# Patient Record
Sex: Male | Born: 1980
Health system: Southern US, Community
[De-identification: ages and names within clinical notes are randomized; demographics above are authoritative.]

## PROBLEM LIST (undated history)

## (undated) DIAGNOSIS — M545 Low back pain, unspecified: Secondary | ICD-10-CM

## (undated) DIAGNOSIS — M75100 Unspecified rotator cuff tear or rupture of unspecified shoulder, not specified as traumatic: Secondary | ICD-10-CM

## (undated) DIAGNOSIS — I10 Essential (primary) hypertension: Secondary | ICD-10-CM

## (undated) DIAGNOSIS — E119 Type 2 diabetes mellitus without complications: Secondary | ICD-10-CM

## (undated) HISTORY — DX: Essential (primary) hypertension: I10

## (undated) HISTORY — DX: Unspecified rotator cuff tear or rupture of unspecified shoulder, not specified as traumatic: M75.100

## (undated) HISTORY — DX: Low back pain, unspecified: M54.50

## (undated) HISTORY — DX: Low back pain: M54.5

## (undated) HISTORY — DX: Type 2 diabetes mellitus without complications: E11.9

---

## 2008-08-31 ENCOUNTER — Emergency Department (HOSPITAL_BASED_OUTPATIENT_CLINIC_OR_DEPARTMENT_OTHER): Admission: EM | Admit: 2008-08-31 | Discharge: 2008-09-01 | Payer: Self-pay | Admitting: Emergency Medicine

## 2008-09-10 ENCOUNTER — Ambulatory Visit: Payer: Self-pay | Admitting: Physical Medicine & Rehabilitation

## 2008-09-20 ENCOUNTER — Encounter
Admission: RE | Admit: 2008-09-20 | Discharge: 2008-10-21 | Payer: Self-pay | Admitting: Physical Medicine & Rehabilitation

## 2009-02-22 ENCOUNTER — Emergency Department (HOSPITAL_BASED_OUTPATIENT_CLINIC_OR_DEPARTMENT_OTHER): Admission: EM | Admit: 2009-02-22 | Discharge: 2009-02-22 | Payer: Self-pay | Admitting: Emergency Medicine

## 2009-02-26 ENCOUNTER — Emergency Department (HOSPITAL_BASED_OUTPATIENT_CLINIC_OR_DEPARTMENT_OTHER): Admission: EM | Admit: 2009-02-26 | Discharge: 2009-02-26 | Payer: Self-pay | Admitting: Emergency Medicine

## 2009-02-26 ENCOUNTER — Ambulatory Visit: Payer: Self-pay | Admitting: Diagnostic Radiology

## 2010-10-21 LAB — DIFFERENTIAL
Basophils Relative: 1 % (ref 0–1)
Eosinophils Absolute: 0.2 10*3/uL (ref 0.0–0.7)
Neutrophils Relative %: 83 % — ABNORMAL HIGH (ref 43–77)

## 2010-10-21 LAB — COMPREHENSIVE METABOLIC PANEL
ALT: 51 U/L (ref 0–53)
Alkaline Phosphatase: 77 U/L (ref 39–117)
CO2: 29 mEq/L (ref 19–32)
GFR calc non Af Amer: >60 mL/min (ref >60)
Glucose, Bld: 125 mg/dL — ABNORMAL HIGH (ref 70–99)
Potassium: 4.1 mEq/L (ref 3.5–5.1)
Sodium: 147 mEq/L — ABNORMAL HIGH (ref 135–145)
Total Bilirubin: 1.3 mg/dL — ABNORMAL HIGH (ref 0.3–1.2)

## 2010-10-21 LAB — URINALYSIS, ROUTINE W REFLEX MICROSCOPIC
Ketones, ur: NEGATIVE mg/dL
Nitrite: NEGATIVE
Protein, ur: NEGATIVE mg/dL

## 2010-10-21 LAB — CBC
Hemoglobin: 17.1 g/dL — ABNORMAL HIGH (ref 13.0–17.0)
MCHC: 33.8 g/dL (ref 30.0–36.0)
RBC: 5.86 MIL/uL — ABNORMAL HIGH (ref 4.22–5.81)

## 2010-10-21 LAB — LIPASE, BLOOD: Lipase: 18 U/L — ABNORMAL LOW (ref 23–300)

## 2010-11-28 NOTE — Consult Note (Signed)
CONSULT REQUESTED BY:  This is a referral through the ED, Shelda Jakes, MD   CHIEF COMPLAINT:  Low back pain.   HISTORY:  A 30 year old male who states that he was stretching backwards  when he had onset of sharp, severe low back pain approximately 10 days  ago.  He went to the [facility] ED, where he was evaluated by  ENT physician, was given a prescription for cyclobenzaprine 10 mg t.i.d.  as well as ibuprofen 800 t.i.d. as well as hydrocodone 5/500 #20  tablets.  He has been taking all of these sparingly in fact he has only  taken 8 hydrocodones in the last 10 days.   He has had no lower extremity symptoms.  He has had no bowel or bladder  dysfunction.  His average pain has reduced over time as down to 3.   His pain is worse with bending, standing, improves with rest, and heat,  as well as medication.   Walking tolerance 90 minutes.  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  He states that this was not a  work-related injury.   His review of systems is positive for numbness and tingling, which is in  his hands mainly with coughing or moving, keeping his hands behind his  back.  He also has back spasms centered intermittent in nature.   Oswestry disability index score 12% which is in a mild range.   His past medical history is rather extensive in regards to his low back  and neck pain.  He had onset of pain in the upper back in 2005.  He was  in the [employer].  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  He was evaluated not only by his  primary care physician, but also Neurosurgery and Orthopedic Surgery.  They did an MRI of his cervical spine, which I did review the report  indicated some multilevel spondylosis, but this really looks mild except  at C7-T1, where he had some right-sided disk osteophyte  complex.   I do not see any imaging studies of his lumbar spine.  He denies any  history of sciatica in his lumbar spine.  He went through some physical  therapy for his upper back and this was helpful.  He also had an EMG and  CV in 2008 to evaluate his upper extremity numbness.  He states that he  did not tolerate it very well.  He was able to get the nerve conduction  study, but not the needle study.   Current medications are listed above.  His other past medical history is  otherwise negative.  I also reviewed some records from [place] where  he used to live up until about a year ago.  I also reviewed orthopedic  surgeon note, which referred to a CT lumbar spine showing central canal  stenosis, lateral recess stenosis L3-L4, retrolisthesis L3 on L4.  I do  not have the actual report, however.   PHYSICAL EXAMINATION:  GENERAL:  No acute distress.  Mood and affect  appropriate.  NECK:  Full range of motion.  His Spurling maneuver is negative.  Neck  has no evidence of scoliosis on inspection.  MUSCULOSKELETAL:  His impingement testing is negative for shoulders.  He  has 5/5  strength bilateral deltoid, biceps, triceps, grip as well as hip  flexion, extension, ankle dorsiflexion.  Sensation in the upper and  lower extremities to pinprick and light touch is normal.  He has full  range of motion upper and lower extremities.  NEUROLOGIC:  Gait is normal.  He is able toe walk, heel walk.  His back  has some tenderness midline around L4-L5, but this is not severe.  His  pain with extension is greater than flexion, but has full range.   IMPRESSION:  1. Lumbar pain may be lumbar strain versus an aggravation of      preexisting lumbar facet syndrome.  He has no signs of sciatica and      is gradually improving with time rather than worsening, therefore I      do not think any additional imaging studies are needed at this      time.  He has been using his medications sparingly and still  has      plenty left.  2. Cervical and thoracic pain.  I believe he may have a thoracic      outlet syndrome to account for his upper extremity symptoms given      that he has pain mainly with putting his hand behind his back as      well as coughing rather than with neck range of motion.Marland Kitchen   RECOMMENDATIONS:  We would have him go through some physical therapy,  fitness focus program looking at the cervical and thoracic stabilization  as well as lumbar flexion exercises.  Failing this may consider some  diagnostic injections, although at this point, his pain is relatively  mild and really do not expect have to go down the road.  This has been  discussed with the patient in detail.  I also explained using a spine  model.  He is agreement with the plan.  I will see him back in 2 weeks  after he goes through some physical therapy.      Erick Colace, M.D.  Electronically Signed     AEK/MedQ  D:09/10/2008 10:24:10  T:09/10/2008 21:50:42  Job #:  [id number]

## 2014-09-06 ENCOUNTER — Encounter (HOSPITAL_BASED_OUTPATIENT_CLINIC_OR_DEPARTMENT_OTHER): Payer: Self-pay | Admitting: *Deleted

## 2014-09-06 ENCOUNTER — Emergency Department (HOSPITAL_BASED_OUTPATIENT_CLINIC_OR_DEPARTMENT_OTHER)
Admission: EM | Admit: 2014-09-06 | Discharge: 2014-09-07 | Disposition: A | Discharge status: Home / Self Care | Payer: Non-veteran care | Attending: Emergency Medicine | Admitting: Emergency Medicine

## 2014-09-06 DIAGNOSIS — M791 Myalgia: Secondary | ICD-10-CM | POA: Insufficient documentation

## 2014-09-06 DIAGNOSIS — M5417 Radiculopathy, lumbosacral region: Secondary | ICD-10-CM | POA: Insufficient documentation

## 2014-09-06 DIAGNOSIS — Z791 Long term (current) use of non-steroidal anti-inflammatories (NSAID): Secondary | ICD-10-CM | POA: Diagnosis not present

## 2014-09-06 DIAGNOSIS — M545 Low back pain: Secondary | ICD-10-CM | POA: Diagnosis present

## 2014-09-06 LAB — URINALYSIS, ROUTINE W REFLEX MICROSCOPIC
BILIRUBIN URINE: NEGATIVE
GLUCOSE, UA: NEGATIVE mg/dL
Ketones, ur: NEGATIVE mg/dL
Leukocytes, UA: NEGATIVE
Nitrite: NEGATIVE
PH: 6 (ref 5.0–8.0)
Protein, ur: NEGATIVE mg/dL
SPECIFIC GRAVITY, URINE: 1.01 (ref 1.005–1.030)
UROBILINOGEN UA: 0.2 mg/dL (ref 0.0–1.0)

## 2014-09-06 LAB — URINE MICROSCOPIC-ADD ON

## 2014-09-06 MED ORDER — PREDNISONE 50 MG PO TABS: 50.0000 mg | ORAL_TABLET | Freq: Every day | ORAL | Status: DC

## 2014-09-06 MED ORDER — IBUPROFEN 400 MG PO TABS
600.0000 mg | ORAL_TABLET | Freq: Once | ORAL | Status: AC
  Administered 2014-09-06: 600 mg via ORAL
  Filled 2014-09-06 (×2): qty 1

## 2014-09-06 MED ORDER — HYDROCODONE-ACETAMINOPHEN 5-325 MG PO TABS
2.0000 | ORAL_TABLET | Freq: Once | ORAL | Status: AC
  Administered 2014-09-06: 2 via ORAL
  Filled 2014-09-06: qty 2

## 2014-09-06 MED ORDER — PREDNISONE 50 MG PO TABS
60.0000 mg | ORAL_TABLET | Freq: Once | ORAL | Status: AC
  Administered 2014-09-06: 60 mg via ORAL
  Filled 2014-09-06 (×2): qty 1

## 2014-09-06 MED ORDER — HYDROCODONE-ACETAMINOPHEN 5-325 MG PO TABS: 1.0000 | ORAL_TABLET | Freq: Four times a day (QID) | ORAL | Status: DC | PRN

## 2014-09-06 MED ORDER — DIAZEPAM 5 MG PO TABS
5.0000 mg | ORAL_TABLET | Freq: Once | ORAL | Status: AC
  Administered 2014-09-06: 5 mg via ORAL
  Filled 2014-09-06: qty 1

## 2014-09-06 NOTE — ED Provider Notes (Signed)
CSN: [id number]     Arrival date & time 09/06/14  2205 History  This chart was scribed for No att. providers found by [facility], ED Scribe. The patient was seen in MH06/MH06 and the patient's care was started at 11:17 PM.  Chief Complaint  Patient presents with  . Back Pain   Patient is a 34 y.o. male presenting with back pain. The history is provided by the patient. No language interpreter was used.  Back Pain Associated symptoms: no abdominal pain and no numbness     HPI Comments: James Mayer is a 34 y.o. male who presents to the Emergency Department complaining of constant sharp back pain ongoing for 10 years but this incident worsened 3 1/2 hours ago. Pt states he was uncomfortable while sitting at work, and did move a piece of furniture which was about 270 pounds with the help of two others. He was not lifting because of his back. Pain started a couple hours helping move. He also has pain in his left hip and radiating down his leg to his knee. Walking up stairs worsens the pain. He takes meloxicam, baclofen and tramadol for relief. No new numbness or tingling, no urine or bowel incontinence.   History reviewed. No pertinent past medical history. History reviewed. No pertinent past surgical history. History reviewed. No pertinent family history. History  Substance Use Topics  . Smoking status: Never Smoker   . Smokeless tobacco: Not on file  . Alcohol Use: No    Review of Systems  Gastrointestinal: Negative for abdominal pain.  Musculoskeletal: Positive for myalgias and back pain.  Neurological: Negative for numbness.   Allergies  Review of patient's allergies indicates no known allergies.  Home Medications   Prior to Admission medications   Medication Sig Start Date End Date Taking? Authorizing Provider  meloxicam (MOBIC) 7.5 MG tablet Take 7.5 mg by mouth daily.   Yes Historical Provider, MD  HYDROcodone-acetaminophen (NORCO/VICODIN) 5-325 MG per tablet Take 1  tablet by mouth every 6 (six) hours as needed. 09/06/14   Derwood Kaplan, MD  predniSONE (DELTASONE) 50 MG tablet Take 1 tablet (50 mg total) by mouth daily. 09/06/14   Donis Kotowski Rhunette Croft, MD   BP 157/93 mmHg  Pulse 97  Temp(Src) 98.2 F (36.8 C)  Resp 16  Ht  (1.803 m)  Wt 246 lb (111.585 kg)  BMI 34.33 kg/m2  SpO2 99% Physical Exam  Constitutional: He is oriented to person, place, and time. He appears well-developed and well-nourished. No distress.  HENT:  Head: Normocephalic and atraumatic.  Eyes: Pupils are equal, round, and reactive to light.  Neck: Normal range of motion.  Cardiovascular: Normal rate, regular rhythm and normal heart sounds.   Pulmonary/Chest: Effort normal and breath sounds normal. No respiratory distress.  Abdominal: Soft.  Musculoskeletal: Normal range of motion.  Lower lumbar and sacral area are TTP. TTP perispinal region as well. Negative passive straight leg raise bilaterally.  Neurological: He is alert and oriented to person, place, and time.  Reflex Scores:      Patellar reflexes are 2+ on the right side and 2+ on the left side. Skin: Skin is warm and dry.  Psychiatric: He has a normal mood and affect. His behavior is normal.  Nursing note and vitals reviewed.   ED Course  Procedures  DIAGNOSTIC STUDIES: Oxygen Saturation is 99% on room air, normal by my interpretation.    COORDINATION OF CARE: 7:39 AM Discussed treatment plan with pt at bedside  and pt agreed to plan.  Labs Review Labs Reviewed  URINALYSIS, ROUTINE W REFLEX MICROSCOPIC - Abnormal; Notable for the following:    Hgb urine dipstick TRACE (*)    All other components within normal limits  URINE MICROSCOPIC-ADD ON    Imaging Review No results found.   EKG Interpretation None      MDM   Final diagnoses:  Lumbosacral radiculopathy   DDx includes: - DJD of the back - Spondylitises/ spondylosis - Sciatica - Spinal cord compression - Conus medullaris - Epidural  hematoma - Epidural abscess - Lytic/pathologic fracture - Myelitis - Musculoskeletal pain   Pt comes in with cc of back pain. Has NO NEW associated numbness, weakness, urinary incontinence, urinary retention, bowel incontinence, saddle anesthesia and the neuro exam is reassuring. Suspect some radicular pain - and will give him appropriate pain meds. Advised pcp f/u if not improving.    Derwood KaplanAnkit Evin Chirco, MD 09/07/14 [phone]

## 2014-09-06 NOTE — ED Notes (Signed)
Pt c/o bil lower back pain x 1 day

## 2014-09-06 NOTE — Discharge Instructions (Signed)
We saw you in the ER for back pain. Our exam doesn't indicate any spinal cord involvement - and thus we feel comfortable sending you home. Please take the meloxicam as prescribed and the muscle relaxant as well. Take the stronger pain meds as needed, and see your primary care doctor for further pain control.  Please use the back exercises to strengthen the back muscles.   Back Exercises Back exercises help treat and prevent back injuries. The goal of back exercises is to increase the strength of your abdominal and back muscles and the flexibility of your back. These exercises should be started when you no longer have back pain. Back exercises include:  Pelvic Tilt. Lie on your back with your knees bent. Tilt your pelvis until the lower part of your back is against the floor. Hold this position 5 to 10 sec and repeat 5 to 10 times.  Knee to Chest. Pull first 1 knee up against your chest and hold for 20 to 30 seconds, repeat this with the other knee, and then both knees. This may be done with the other leg straight or bent, whichever feels better.  Sit-Ups or Curl-Ups. Bend your knees 90 degrees. Start with tilting your pelvis, and do a partial, slow sit-up, lifting your trunk only 30 to 45 degrees off the floor. Take at least 2 to 3 seconds for each sit-up. Do not do sit-ups with your knees out straight. If partial sit-ups are difficult, simply do the above but with only tightening your abdominal muscles and holding it as directed.  Hip-Lift. Lie on your back with your knees flexed 90 degrees. Push down with your feet and shoulders as you raise your hips a couple inches off the floor; hold for 10 seconds, repeat 5 to 10 times.  Back arches. Lie on your stomach, propping yourself up on bent elbows. Slowly press on your hands, causing an arch in your low back. Repeat 3 to 5 times. Any initial stiffness and discomfort should lessen with repetition over time.  Shoulder-Lifts. Lie face down with arms  beside your body. Keep hips and torso pressed to floor as you slowly lift your head and shoulders off the floor. Do not overdo your exercises, especially in the beginning. Exercises may cause you some mild back discomfort which lasts for a few minutes; however, if the pain is more severe, or lasts for more than 15 minutes, do not continue exercises until you see your caregiver. Improvement with exercise therapy for back problems is slow.  See your caregivers for assistance with developing a proper back exercise program. Document Released: 08/09/2004 Document Revised: 09/24/2011 Document Reviewed: 05/03/2011 [facility] Patient Information 2015 [employer]. This information is not intended to replace advice given to you by your health care provider. Make sure you discuss any questions you have with your health care provider.  Lumbosacral Radiculopathy Lumbosacral radiculopathy is a pinched nerve or nerves in the low back (lumbosacral area). When this happens you may have weakness in your legs and may not be able to stand on your toes. You may have pain going down into your legs. There may be difficulties with walking normally. There are many causes of this problem. Sometimes this may happen from an injury, or simply from arthritis or boney problems. It may also be caused by other illnesses such as diabetes. If there is no improvement after treatment, further studies may be done to find the exact cause. DIAGNOSIS  X-rays may be needed if the problems become  long standing. Electromyograms may be done. This study is one in which the working of nerves and muscles is studied. HOME CARE INSTRUCTIONS   Applications of ice packs may be helpful. Ice can be used in a plastic bag with a towel around it to prevent frostbite to skin. This may be used every 2 hours for 20 to 30 minutes, or as needed, while awake, or as directed by your caregiver.  Only take over-the-counter or prescription medicines for pain,  discomfort, or fever as directed by your caregiver.  If physical therapy was prescribed, follow your caregiver's directions. SEEK IMMEDIATE MEDICAL CARE IF:   You have pain not controlled with medications.  You seem to be getting worse rather than better.  You develop increasing weakness in your legs.  You develop loss of bowel or bladder control.  You have difficulty with walking or balance, or develop clumsiness in the use of your legs.  You have a fever. MAKE SURE YOU:   Understand these instructions.  Will watch your condition.  Will get help right away if you are not doing well or get worse. Document Released: 07/02/2005 Document Revised: 09/24/2011 Document Reviewed: 02/20/2008 [facility] Patient Information 2015 [place], [place]. This information is not intended to replace advice given to you by your health care provider. Make sure you discuss any questions you have with your health care provider. RICE: Routine Care for Injuries The routine care of many injuries includes Rest, Ice, Compression, and Elevation (RICE). HOME CARE INSTRUCTIONS  Rest is needed to allow your body to heal. Routine activities can usually be resumed when comfortable. Injured tendons and bones can take up to 6 weeks to heal. Tendons are the cord-like structures that attach muscle to bone.  Ice following an injury helps keep the swelling down and reduces pain.  Put ice in a plastic bag.  Place a towel between your skin and the bag.  Leave the ice on for 15-20 minutes, 3-4 times a day, or as directed by your health care provider. Do this while awake, for the first 24 to 48 hours. After that, continue as directed by your caregiver.  Compression helps keep swelling down. It also gives support and helps with discomfort. If an elastic bandage has been applied, it should be removed and reapplied every 3 to 4 hours. It should not be applied tightly, but firmly enough to keep swelling down. Watch fingers or  toes for swelling, bluish discoloration, coldness, numbness, or excessive pain. If any of these problems occur, remove the bandage and reapply loosely. Contact your caregiver if these problems continue.  Elevation helps reduce swelling and decreases pain. With extremities, such as the arms, hands, legs, and feet, the injured area should be placed near or above the level of the heart, if possible. SEEK IMMEDIATE MEDICAL CARE IF:  You have persistent pain and swelling.  You develop redness, numbness, or unexpected weakness.  Your symptoms are getting worse rather than improving after several days. These symptoms may indicate that further evaluation or further X-rays are needed. Sometimes, X-rays may not show a small broken bone (fracture) until 1 week or 10 days later. Make a follow-up appointment with your caregiver. Ask when your X-ray results will be ready. Make sure you get your X-ray results. Document Released: 10/14/2000 Document Revised: 07/07/2013 Document Reviewed: 12/01/2010 [facility] Patient Information 2015 [place], [place]. This information is not intended to replace advice given to you by your health care provider. Make sure you discuss any questions you have  with your health care provider.

## 2017-11-14 LAB — CBC AND DIFFERENTIAL
HEMATOCRIT: 44 (ref 41–53)
Hemoglobin: 15.4 (ref 13.5–17.5)
Neutrophils Absolute: 6
Platelets: 245 (ref 150–399)
WBC: 9.2

## 2017-11-14 LAB — HEMOGLOBIN A1C: HEMOGLOBIN A1C: 5.7

## 2017-11-14 LAB — HEPATIC FUNCTION PANEL
ALT: 67 — AB (ref 10–40)
AST: 26 (ref 14–40)
Alkaline Phosphatase: 60 (ref 25–125)
BILIRUBIN DIRECT: 0.2 (ref 0.01–0.4)
BILIRUBIN, TOTAL: 1.4

## 2017-11-14 LAB — LIPID PANEL
Cholesterol: 276 — AB (ref 0–200)
HDL: 36 (ref 35–70)
LDL Cholesterol: 173
Triglycerides: 334 — AB (ref 40–160)

## 2017-11-14 LAB — BASIC METABOLIC PANEL
BUN: 10 (ref 4–21)
CREATININE: 1.1 (ref 0.6–1.3)
GLUCOSE: 98
POTASSIUM: 3.3 — AB (ref 3.4–5.3)
SODIUM: 140 (ref 137–147)

## 2017-11-14 LAB — HM HEPATITIS C SCREENING LAB: HM Hepatitis Screen: NEGATIVE

## 2018-08-19 ENCOUNTER — Ambulatory Visit: Payer: Non-veteran care | Admitting: Family Medicine

## 2018-08-26 ENCOUNTER — Ambulatory Visit (INDEPENDENT_AMBULATORY_CARE_PROVIDER_SITE_OTHER): Payer: BLUE CROSS/BLUE SHIELD | Admitting: Family Medicine

## 2018-08-26 ENCOUNTER — Encounter: Payer: Self-pay | Admitting: Family Medicine

## 2018-08-26 VITALS — BP 162/96 | HR 80 | Temp 98.0°F | Ht 71.0 in | Wt 246.0 lb

## 2018-08-26 DIAGNOSIS — G8929 Other chronic pain: Secondary | ICD-10-CM | POA: Diagnosis not present

## 2018-08-26 DIAGNOSIS — I10 Essential (primary) hypertension: Secondary | ICD-10-CM | POA: Diagnosis not present

## 2018-08-26 DIAGNOSIS — M545 Low back pain: Secondary | ICD-10-CM | POA: Diagnosis not present

## 2018-08-26 MED ORDER — TRAMADOL HCL 50 MG PO TABS: 50.0000 mg | ORAL_TABLET | Freq: Three times a day (TID) | ORAL | 0 refills | Status: DC | PRN

## 2018-08-26 NOTE — Patient Instructions (Signed)
-  Great to meet you today! -We'll be in touch once we get records in from the [place].

## 2018-08-26 NOTE — Progress Notes (Signed)
James BleakJason D Oborn - 38 y.o. male MRN [id number]  Date of birth: 1981-03-12  Subjective Chief Complaint  Patient presents with  . Back Pain    has been chronic in nature-[facility] ([facility] pain is lower back    HPI James Mayer is a 38 y.o. male with history of HTN and chronic low back pain here today for initial visit.  [facility] [place].  He admits frustration with his ongoing back pain and feels like [facility]. He reports having imaging completed, last time was about 5 years ago and was told that he had pars defect.  Pain is located along bilateral lower back.  He denies radiation of pain, numbness or tingling.  He has seen a chiropractor and this was helpful He has been treated with meloxicam and tramadol for several years.  Tramadol is helpful but he doesn't like taking too often as it makes him feel a little "loopy".  Meloxicam does provide some relief.  He has tried muscle relaxer in the past but this made him too drowsy.  He is not sure what labs he has had checked in the past.    BP noted to be elevated today.  [facility]  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  He has considered counseling.  He denies SI.     No Known Allergies  Past Medical History:  Diagnosis Date  . Hypertension   . Low back pain     History reviewed. No pertinent surgical history.  Social History   Socioeconomic History  . Marital status: Married    Spouse name: Not on file  . Number of children: Not on file  . Years of education: Not on file  . Highest education level: Not on file  Occupational History  . Not on file  Social Needs  . Financial resource strain: Not on file  . Food insecurity:    Worry: Not on file    Inability: Not on file  . Transportation needs:    Medical: Not on file     Non-medical: Not on file  Tobacco Use  . Smoking status: Never Smoker  . Smokeless tobacco: Never Used  Substance and Sexual Activity  . Alcohol use: Yes  . Drug use: No  . Sexual activity: Yes    Birth control/protection: None  Lifestyle  . Physical activity:    Days per week: Not on file    Minutes per session: Not on file  . Stress: Not on file  Relationships  . Social connections:    Talks on phone: Not on file    Gets together: Not on file    Attends religious service: Not on file    Active member of club or organization: Not on file    Attends meetings of clubs or organizations: Not on file    Relationship status: Not on file  Other Topics Concern  . Not on file  Social History Narrative  . Not on file    Family History  Problem Relation Age of Onset  . Hypertension Father   . Hyperlipidemia Father   . Diabetes Sister     Health Maintenance  Topic Date Due  . HIV Screening  05/03/1998  . TETANUS/TDAP  05/03/2002  . INFLUENZA  VACCINE  10/14/2018 (Originally 02/13/2018)    ----------------------------------------------------------------------------------------------------------------------------------------------------------------------------------------------------------------- Physical Exam BP (!) 162/96   Pulse 80   Temp 98 F (36.7 C) (Oral)   Ht 5\' 11"  (1.803 m)   Wt 246 lb (111.6 kg)   SpO2 98%   BMI 34.31 kg/m   Physical Exam Constitutional:      Appearance: Normal appearance.  HENT:     Head: Normocephalic and atraumatic.     Mouth/Throat:     Mouth: Mucous membranes are moist.  Neck:     Musculoskeletal: Neck supple.  Cardiovascular:     Rate and Rhythm: Normal rate.     Pulses: Normal pulses.     Heart sounds: Normal heart sounds.  Pulmonary:     Effort: Pulmonary effort is normal.     Breath sounds: Normal breath sounds.  Musculoskeletal:     Comments: ROM of L spine is normal but has some pain with flexion and full extension.   No stepoff palpated.  No SI joint tenderness.  Negative SLR.  Normal strength.   Skin:    General: Skin is warm.  Neurological:     General: No focal deficit present.     Mental Status: He is alert.  Psychiatric:        Mood and Affect: Mood normal.        Behavior: Behavior normal.     ------------------------------------------------------------------------------------------------------------------------------------------------------------------------------------------------------------------- Assessment and Plan  Chronic low back pain -Chronic low back pain, will request records [facility] [place] [facility] to see what work up and imaging he has had so far.   -PDMP reviewed, last tramadol refill 11/2017.  Will refill #30 until [facility].  He will continue meloxicam as well.    Essential hypertension -BP elevated, likely several factors contributing to his baseline HTN including anxiety and pain.   -Discussed stress management -Follow low salt diet.

## 2018-08-26 NOTE — Assessment & Plan Note (Signed)
-  BP elevated, likely several factors contributing to his baseline HTN including anxiety and pain.   -Discussed stress management -Follow low salt diet.

## 2018-08-26 NOTE — Assessment & Plan Note (Signed)
-  Chronic low back pain, [facility] what [facility] up and imaging he has had so far.   -PDMP reviewed, last tramadol refill 11/2017.  Will refill #30 until [facility].  He will continue meloxicam as well.

## 2018-08-29 ENCOUNTER — Encounter: Payer: Self-pay | Admitting: Family Medicine

## 2018-11-10 ENCOUNTER — Telehealth: Payer: Self-pay

## 2018-11-10 NOTE — Telephone Encounter (Signed)
Copied from CRM [id number]. Topic: Appointment Scheduling - Scheduling Inquiry for Clinic >> Nov 10, 2018  1:33 PM Maia Petties wrote: Reason for CRM: Received msg from pts wife requesting to contact pt for an appt. He is having terrible back pain and would like a virtual visit with Dr. Ashley Royalty.

## 2018-11-11 ENCOUNTER — Ambulatory Visit (INDEPENDENT_AMBULATORY_CARE_PROVIDER_SITE_OTHER): Payer: BLUE CROSS/BLUE SHIELD | Admitting: Family Medicine

## 2018-11-11 ENCOUNTER — Encounter: Payer: Self-pay | Admitting: Family Medicine

## 2018-11-11 DIAGNOSIS — M545 Low back pain: Secondary | ICD-10-CM

## 2018-11-11 DIAGNOSIS — G8929 Other chronic pain: Secondary | ICD-10-CM

## 2018-11-11 MED ORDER — TIZANIDINE HCL 4 MG PO TABS: 4.0000 mg | ORAL_TABLET | Freq: Four times a day (QID) | ORAL | 0 refills | Status: DC | PRN

## 2018-11-11 NOTE — Progress Notes (Signed)
James Mayer - 38 y.o. male MRN [id number]  Date of birth: 12/14/1980   This visit type was conducted due to national recommendations for restrictions regarding the COVID-19 Pandemic (e.g. social distancing).  This format is felt to be most appropriate for this patient at this time.  All issues noted in this document were discussed and addressed.  No physical exam was performed (except for noted visual exam findings with Video Visits).  I discussed the limitations of evaluation and management by telemedicine and the availability of in person appointments. The patient expressed understanding and agreed to proceed.  I connected with@ on 11/11/18 at  9:45 AM EDT by a video enabled telemedicine application and verified that I am speaking with the correct person using two identifiers.   Patient Location: Home [address] [place] [address]   Provider location:   Home office  Chief Complaint  Patient presents with  . Back Pain    Onset: 5 days, Hx flared up, episodic, pain level 2-9,L-spin,RADS laterally to LL    HPI  James Mayer is a 38 y.o. male who presents via audio/video conferencing for a telehealth visit today.  He has complaint of low back pain.  This is an acute on chronic issue for him.  He initially started having problem with his back in the [employer] and it has flared up from time to time.  Has tramadol for flare ups but hasn't been much help recently.  He also takes meloxicam daily.  He did have radiation of pain initially but this has improved and his pain overall has improved some today.  He denies weakness, numbness or tingling.    ROS:  A comprehensive ROS was completed and negative except as noted per HPI  Past Medical History:  Diagnosis Date  . Hypertension   . Low back pain     No past surgical history on file.  Family History  Problem Relation Age of Onset  . Hypertension Father   . Hyperlipidemia Father   . Diabetes Sister     Social  History   Socioeconomic History  . Marital status: Married    Spouse name: Not on file  . Number of children: Not on file  . Years of education: Not on file  . Highest education level: Not on file  Occupational History  . Not on file  Social Needs  . Financial resource strain: Not on file  . Food insecurity:    Worry: Not on file    Inability: Not on file  . Transportation needs:    Medical: Not on file    Non-medical: Not on file  Tobacco Use  . Smoking status: Never Smoker  . Smokeless tobacco: Never Used  Substance and Sexual Activity  . Alcohol use: Yes  . Drug use: No  . Sexual activity: Yes    Birth control/protection: None  Lifestyle  . Physical activity:    Days per week: Not on file    Minutes per session: Not on file  . Stress: Not on file  Relationships  . Social connections:    Talks on phone: Not on file    Gets together: Not on file    Attends religious service: Not on file    Active member of club or organization: Not on file    Attends meetings of clubs or organizations: Not on file    Relationship status: Not on file  . Intimate partner violence:    Fear of current  or ex partner: Not on file    Emotionally abused: Not on file    Physically abused: Not on file    Forced sexual activity: Not on file  Other Topics Concern  . Not on file  Social History Narrative  . Not on file     Current Outpatient Medications:  .  hydrochlorothiazide (HYDRODIURIL) 25 MG tablet, Take 25 mg by mouth daily., Disp: , Rfl:  .  meloxicam (MOBIC) 7.5 MG tablet, Take 7.5 mg by mouth daily., Disp: , Rfl:  .  traMADol (ULTRAM) 50 MG tablet, Take 1 tablet (50 mg total) by mouth every 8 (eight) hours as needed for moderate pain., Disp: 30 tablet, Rfl: 0 .  tiZANidine (ZANAFLEX) 4 MG tablet, Take 1 tablet (4 mg total) by mouth every 6 (six) hours as needed for muscle spasms., Disp: 30 tablet, Rfl: 0  EXAM:  VITALS per patient if applicable: Temp 98.1 F (36.7 C) (Oral)  Comment: took @ [facility]  Ht 5' 11.5" (1.816 m)   Wt 247 lb (112 kg) Comment: Pt took at [employer].  BMI 33.97 kg/m   GENERAL: alert, oriented, appears well and in no acute distress  HEENT: atraumatic, conjunttiva clear, no obvious abnormalities on inspection of external nose and ears  NECK: normal movements of the head and neck  LUNGS: on inspection no signs of respiratory distress, breathing rate appears normal, no obvious gross SOB, gasping or wheezing  CV: no obvious cyanosis  MS: moves all visible extremities without noticeable abnormality  PSYCH/NEURO: pleasant and cooperative, no obvious depression or anxiety, speech and thought processing grossly intact  ASSESSMENT AND PLAN:  Discussed the following assessment and plan:  Chronic low back pain -Acute on chronic problem.  -Continue meloxicam with tramadol prn.  -Will add on tizanidine.  -Recommend that he try to keep moving and keep back stretched out.  -He will call back if not improving.        I discussed the assessment and treatment plan with the patient. The patient was provided an opportunity to ask questions and all were answered. The patient agreed with the plan and demonstrated an understanding of the instructions.   The patient was advised to call back or seek an in-person evaluation if the symptoms worsen or if the condition fails to improve as anticipated.     James Mayer James Pullman, DO

## 2018-11-11 NOTE — Assessment & Plan Note (Signed)
-  Acute on chronic problem.  -Continue meloxicam with tramadol prn.  -Will add on tizanidine.  -Recommend that he try to keep moving and keep back stretched out.  -He will call back if not improving.

## 2019-02-12 ENCOUNTER — Telehealth (INDEPENDENT_AMBULATORY_CARE_PROVIDER_SITE_OTHER): Payer: BC Managed Care – PPO | Admitting: Family Medicine

## 2019-02-12 ENCOUNTER — Telehealth: Payer: Self-pay

## 2019-02-12 ENCOUNTER — Encounter: Payer: Self-pay | Admitting: Family Medicine

## 2019-02-12 DIAGNOSIS — M545 Low back pain, unspecified: Secondary | ICD-10-CM

## 2019-02-12 DIAGNOSIS — G8929 Other chronic pain: Secondary | ICD-10-CM | POA: Diagnosis not present

## 2019-02-12 MED ORDER — TRAMADOL HCL 50 MG PO TABS: 50.0000 mg | ORAL_TABLET | Freq: Three times a day (TID) | ORAL | 0 refills | Status: DC | PRN

## 2019-02-12 MED ORDER — PREDNISONE 10 MG PO TABS: ORAL_TABLET | ORAL | 0 refills | Status: DC

## 2019-02-12 MED ORDER — TIZANIDINE HCL 4 MG PO TABS: 4.0000 mg | ORAL_TABLET | Freq: Four times a day (QID) | ORAL | 0 refills | Status: DC | PRN

## 2019-02-12 NOTE — Progress Notes (Signed)
James Mayer - 38 y.o. male MRN [id number]  Date of birth: Sep 12, 1980   This visit type was conducted due to national recommendations for restrictions regarding the COVID-19 Pandemic (e.g. social distancing).  This format is felt to be most appropriate for this patient at this time.  All issues noted in this document were discussed and addressed.  No physical exam was performed (except for noted visual exam findings with Video Visits).  I discussed the limitations of evaluation and management by telemedicine and the availability of in person appointments. The patient expressed understanding and agreed to proceed.  I connected with@ on 02/12/19 at  8:30 AM EDT by a video enabled telemedicine application and verified that I am speaking with the correct person using two identifiers.   Patient Location: Home  [address] [place] [address]   Provider location:   Home office  Chief Complaint  Patient presents with  . Back Pain    Pt agrees to virtual visit.  He is C/O chronic LBP. Current flare-up started 2.5 weeks-ago. Is reqesting Tramadol to help with his pain.  He states that he has 15 Tizanidine left. Does not have any vitals.    HPI  James Mayer is a 38 y.o. male who presents via audio/video conferencing for a telehealth visit today.  He has complaint of low back pain. Has history of chronic low back pain with severe flare up intermittently.  Reports that his flare ups often occur when he coughs of sneezes hard, which precipitated this event.  He is taking meloxicam 15mg  daily and will add on tizanidine and tramadol when he is having a flare.  He has tried PT in the past with variable improvement, however the effects have not been long lasting.  He did see a chiropractor for a visit which was helpful briefly but has not been back since.  He does have some mild numbness and also gets some occasional tingling in hands with sneezing.  He denies weakness.    ROS:  A  comprehensive ROS was completed and negative except as noted per HPI  Past Medical History:  Diagnosis Date  . Hypertension   . Low back pain     No past surgical history on file.  Family History  Problem Relation Age of Onset  . Hypertension Father   . Hyperlipidemia Father   . Diabetes Sister     Social History   Socioeconomic History  . Marital status: Married    Spouse name: Not on file  . Number of children: Not on file  . Years of education: Not on file  . Highest education level: Not on file  Occupational History  . Not on file  Social Needs  . Financial resource strain: Not on file  . Food insecurity    Worry: Not on file    Inability: Not on file  . Transportation needs    Medical: Not on file    Non-medical: Not on file  Tobacco Use  . Smoking status: Never Smoker  . Smokeless tobacco: Never Used  Substance and Sexual Activity  . Alcohol use: Yes  . Drug use: No  . Sexual activity: Yes    Birth control/protection: None  Lifestyle  . Physical activity    Days per week: Not on file    Minutes per session: Not on file  . Stress: Not on file  Relationships  . Social connections    Talks on phone: Not on file  Gets together: Not on file    Attends religious service: Not on file    Active member of club or organization: Not on file    Attends meetings of clubs or organizations: Not on file    Relationship status: Not on file  . Intimate partner violence    Fear of current or ex partner: Not on file    Emotionally abused: Not on file    Physically abused: Not on file    Forced sexual activity: Not on file  Other Topics Concern  . Not on file  Social History Narrative  . Not on file     Current Outpatient Medications:  .  hydrochlorothiazide (HYDRODIURIL) 25 MG tablet, Take 25 mg by mouth daily., Disp: , Rfl:  .  meloxicam (MOBIC) 7.5 MG tablet, Take 7.5 mg by mouth daily., Disp: , Rfl:  .  tiZANidine (ZANAFLEX) 4 MG tablet, Take 1 tablet (4  mg total) by mouth every 6 (six) hours as needed for muscle spasms., Disp: 30 tablet, Rfl: 0 .  traMADol (ULTRAM) 50 MG tablet, Take 1 tablet (50 mg total) by mouth every 8 (eight) hours as needed for moderate pain., Disp: 30 tablet, Rfl: 0  EXAM:  VITALS per patient if applicable: There were no vitals taken for this visit.  GENERAL: alert, oriented, appears well and in no acute distress  HEENT: atraumatic, conjunttiva clear, no obvious abnormalities on inspection of external nose and ears  NECK: normal movements of the head and neck  LUNGS: on inspection no signs of respiratory distress, breathing rate appears normal, no obvious gross SOB, gasping or wheezing  CV: no obvious cyanosis  MS: moves all visible extremities without noticeable abnormality  PSYCH/NEURO: pleasant and cooperative, no obvious depression or anxiety, speech and thought processing grossly intact  ASSESSMENT AND PLAN:  Discussed the following assessment and plan:  Chronic low back pain -Currently having acute flare of chronic pain.  -Will add on predisone and he will continue tizanidine and tramadol as needed.  -Has HEP that he is working on at home.  -Discussed referral back to PT or to Dr. Terrilee FilesZach Smith for OMT if not improving.        I discussed the assessment and treatment plan with the patient. The patient was provided an opportunity to ask questions and all were answered. The patient agreed with the plan and demonstrated an understanding of the instructions.   The patient was advised to call back or seek an in-person evaluation if the symptoms worsen or if the condition fails to improve as anticipated.    Everrett Coombeody Yameli Delamater, DO

## 2019-02-12 NOTE — Telephone Encounter (Signed)
I LM for pt.

## 2019-02-12 NOTE — Assessment & Plan Note (Signed)
-  Currently having acute flare of chronic pain.  -Will add on predisone and he will continue tizanidine and tramadol as needed.  -Has HEP that he is working on at home.  -Discussed referral back to PT or to Dr. Charlann Boxer for OMT if not improving.

## 2019-04-14 ENCOUNTER — Encounter: Payer: Self-pay | Admitting: Family Medicine

## 2019-04-14 ENCOUNTER — Ambulatory Visit (INDEPENDENT_AMBULATORY_CARE_PROVIDER_SITE_OTHER): Payer: BC Managed Care – PPO | Admitting: Family Medicine

## 2019-04-14 ENCOUNTER — Other Ambulatory Visit: Payer: Self-pay

## 2019-04-14 VITALS — BP 145/80 | HR 97 | Wt 248.8 lb

## 2019-04-14 DIAGNOSIS — R197 Diarrhea, unspecified: Secondary | ICD-10-CM | POA: Diagnosis not present

## 2019-04-14 DIAGNOSIS — M2550 Pain in unspecified joint: Secondary | ICD-10-CM

## 2019-04-14 LAB — SEDIMENTATION RATE: Sed Rate: 15 mm/hr (ref 0–15)

## 2019-04-14 LAB — C-REACTIVE PROTEIN: CRP: <1 mg/dL (ref 0.5–20.0)

## 2019-04-14 LAB — URIC ACID: Uric Acid, Serum: 7.9 mg/dL — ABNORMAL HIGH (ref 4.0–7.8)

## 2019-04-14 MED ORDER — TRAMADOL HCL 50 MG PO TABS: 50.0000 mg | ORAL_TABLET | Freq: Three times a day (TID) | ORAL | 1 refills | Status: DC | PRN

## 2019-04-14 MED ORDER — DIPHENOXYLATE-ATROPINE 2.5-0.025 MG PO TABS: 1.0000 | ORAL_TABLET | Freq: Four times a day (QID) | ORAL | 0 refills | Status: DC | PRN

## 2019-04-14 NOTE — Assessment & Plan Note (Signed)
  Lab Orders     Sedimentation rate     C-reactive protein     Uric acid Recommend OTC 1% diclofenac to area and icing.

## 2019-04-14 NOTE — Assessment & Plan Note (Signed)
-  Likely viral in nature.  No other symptoms suggestive of COVID at this time.  Will check inflammatory markers given associated joint pain as well.  Discussed further work up if not improving over the next week or so.

## 2019-04-14 NOTE — Patient Instructions (Signed)
Try voltaren gel and icing to thumb area.  We'll be in touch with lab results.

## 2019-04-14 NOTE — Progress Notes (Signed)
James Mayer - 38 y.o. male MRN [id number]  Date of birth: 1980-08-16  Subjective Chief Complaint  Patient presents with  . Hand Pain    Right hand    HPI James Mayer is a 38 y.o. male with history of chronic low back pain and HTN here today with complaint of R hand pain and diarrhea.  He reports that diarrhea started a day or two ago.  Stools are very loose.  He does have some abdominal pain and cramping and pain.  He denies blood in his stool or dark stools.  Pain is throughout the abdomen and is non-radiating and non-migratory.  He has no known family history of inflammatory bowel disease.    In regards to the hand/wrist pain.  He woke up this morning with R hand and wrist pain with swelling around the base of the R thumb.  He denies any known injury or overuse.  Denies pain in other joints other than his back which is chronic.    ROS:  A comprehensive ROS was completed and negative except as noted per HPI  No Known Allergies  Past Medical History:  Diagnosis Date  . Hypertension   . Low back pain     No past surgical history on file.  Social History   Socioeconomic History  . Marital status: Married    Spouse name: Not on file  . Number of children: Not on file  . Years of education: Not on file  . Highest education level: Not on file  Occupational History  . Not on file  Social Needs  . Financial resource strain: Not on file  . Food insecurity    Worry: Not on file    Inability: Not on file  . Transportation needs    Medical: Not on file    Non-medical: Not on file  Tobacco Use  . Smoking status: Never Smoker  . Smokeless tobacco: Never Used  Substance and Sexual Activity  . Alcohol use: Yes  . Drug use: No  . Sexual activity: Yes    Birth control/protection: None  Lifestyle  . Physical activity    Days per week: Not on file    Minutes per session: Not on file  . Stress: Not on file  Relationships  . Social Musician on phone: Not  on file    Gets together: Not on file    Attends religious service: Not on file    Active member of club or organization: Not on file    Attends meetings of clubs or organizations: Not on file    Relationship status: Not on file  Other Topics Concern  . Not on file  Social History Narrative  . Not on file    Family History  Problem Relation Age of Onset  . Hypertension Father   . Hyperlipidemia Father   . Diabetes Sister     Health Maintenance  Topic Date Due  . HIV Screening  05/03/1998  . TETANUS/TDAP  05/03/2002  . INFLUENZA VACCINE  02/14/2019    ----------------------------------------------------------------------------------------------------------------------------------------------------------------------------------------------------------------- Physical Exam BP (!) 145/80   Pulse 97   Wt 248 lb 12.8 oz (112.9 kg)   SpO2 97%   BMI 34.22 kg/m   Physical Exam Constitutional:      Appearance: Normal appearance.  HENT:     Head: Normocephalic and atraumatic.     Mouth/Throat:     Mouth: Mucous membranes are moist.  Cardiovascular:     Rate and  Rhythm: Normal rate and regular rhythm.  Pulmonary:     Effort: Pulmonary effort is normal.     Breath sounds: Normal breath sounds.  Abdominal:     General: Abdomen is flat. There is no distension.     Tenderness: There is no abdominal tenderness.  Musculoskeletal:     Comments: TTP along base of thumb at [facility] joint on R hand.   Skin:    General: Skin is warm and dry.  Neurological:     General: No focal deficit present.     Mental Status: He is alert.  Psychiatric:        Mood and Affect: Mood normal.        Behavior: Behavior normal.     ------------------------------------------------------------------------------------------------------------------------------------------------------------------------------------------------------------------- Assessment and Plan  Arthralgia  Lab Orders      Sedimentation rate     C-reactive protein     Uric acid Recommend OTC 1% diclofenac to area and icing.    Diarrhea -Likely viral in nature.  No other symptoms suggestive of COVID at this time.  Will check inflammatory markers given associated joint pain as well.  Discussed further work up if not improving over the next week or so.

## 2019-04-16 ENCOUNTER — Telehealth: Payer: Self-pay | Admitting: Family Medicine

## 2019-04-16 NOTE — Telephone Encounter (Signed)
Pt wife Abbi called for lab results. Cb# [phone] Copied from [place] [id number]. Topic: Quick Communication - Lab Results (Clinic Use ONLY) >> Apr 16, 2019  2:53 PM Noitamyae, Phetcharat, LPN wrote: Called patient to inform them of 04/16/2019 lab results. When patient returns call, triage nurse may disclose results.

## 2019-04-16 NOTE — Progress Notes (Signed)
Inflammatory markers are normal.  Uric acid levels are slightly elevated, which can cause gout.  Has hand/wrist pain improved?

## 2019-04-17 ENCOUNTER — Other Ambulatory Visit: Payer: Self-pay | Admitting: Family Medicine

## 2019-04-17 MED ORDER — METHYLPREDNISOLONE 4 MG PO TBPK: ORAL_TABLET | ORAL | 0 refills | Status: DC

## 2019-04-17 NOTE — Progress Notes (Signed)
Course of steroid sent in, if not improving with this let me know and I'll enter referral to sports medicine.

## 2019-07-31 ENCOUNTER — Other Ambulatory Visit: Payer: Self-pay

## 2019-07-31 ENCOUNTER — Encounter: Payer: Self-pay | Admitting: Family Medicine

## 2019-07-31 ENCOUNTER — Ambulatory Visit (INDEPENDENT_AMBULATORY_CARE_PROVIDER_SITE_OTHER): Payer: BC Managed Care – PPO

## 2019-07-31 ENCOUNTER — Ambulatory Visit (INDEPENDENT_AMBULATORY_CARE_PROVIDER_SITE_OTHER): Payer: BC Managed Care – PPO | Admitting: Family Medicine

## 2019-07-31 ENCOUNTER — Ambulatory Visit: Payer: BC Managed Care – PPO | Admitting: Family Medicine

## 2019-07-31 VITALS — BP 140/92 | HR 89 | Temp 96.3°F | Ht 71.5 in | Wt 249.6 lb

## 2019-07-31 DIAGNOSIS — M2550 Pain in unspecified joint: Secondary | ICD-10-CM

## 2019-07-31 DIAGNOSIS — M545 Low back pain, unspecified: Secondary | ICD-10-CM

## 2019-07-31 DIAGNOSIS — G8929 Other chronic pain: Secondary | ICD-10-CM

## 2019-07-31 DIAGNOSIS — R631 Polydipsia: Secondary | ICD-10-CM

## 2019-07-31 DIAGNOSIS — M2540 Effusion, unspecified joint: Secondary | ICD-10-CM | POA: Diagnosis not present

## 2019-07-31 LAB — COMPREHENSIVE METABOLIC PANEL
ALT: 43 U/L (ref 0–53)
AST: 30 U/L (ref 0–37)
Albumin: 4.5 g/dL (ref 3.5–5.2)
Alkaline Phosphatase: 64 U/L (ref 39–117)
BUN: 12 mg/dL (ref 6–23)
CO2: 27 mEq/L (ref 19–32)
Calcium: 9.8 mg/dL (ref 8.4–10.5)
Chloride: 102 mEq/L (ref 96–112)
Creatinine, Ser: 0.92 mg/dL (ref 0.40–1.50)
GFR: 92.96 mL/min (ref >60.00)
Glucose, Bld: 88 mg/dL (ref 70–99)
Potassium: 3.4 mEq/L — ABNORMAL LOW (ref 3.5–5.1)
Sodium: 139 mEq/L (ref 135–145)
Total Bilirubin: 0.9 mg/dL (ref 0.2–1.2)
Total Protein: 7.3 g/dL (ref 6.0–8.3)

## 2019-07-31 LAB — URIC ACID: Uric Acid, Serum: 7.2 mg/dL (ref 4.0–7.8)

## 2019-07-31 LAB — HEMOGLOBIN A1C: Hgb A1c MFr Bld: 6 % (ref 4.6–6.5)

## 2019-07-31 MED ORDER — TRAMADOL HCL 50 MG PO TABS: 50.0000 mg | ORAL_TABLET | Freq: Three times a day (TID) | ORAL | 1 refills | Status: DC | PRN

## 2019-07-31 MED ORDER — TIZANIDINE HCL 4 MG PO TABS: 4.0000 mg | ORAL_TABLET | Freq: Four times a day (QID) | ORAL | 0 refills | Status: DC | PRN

## 2019-07-31 MED ORDER — KETOROLAC TROMETHAMINE 60 MG/2ML IM SOLN
60.0000 mg | Freq: Once | INTRAMUSCULAR | Status: AC
  Administered 2019-07-31: 14:00:00 60 mg via INTRAMUSCULAR

## 2019-07-31 NOTE — Progress Notes (Signed)
MARQUAIL BRADWELL - 39 y.o. male MRN [id number]  Date of birth: 18-Jul-1980  Subjective Chief Complaint  Patient presents with  . Back Pain    started yesterday, lower middle, took last tramadol this morning    HPI  DENZIL MCEACHRON is a 39 y.o. male with history of chronic low back pain and spondylolysis here today with acute back pain.  He reports that current episode started a few days ago.  He has used meloxicam and tramadol without much relief.  He denies radiation of pain, numbness, tingling or weakness into extremities.  Current pain is a little worse than his typical pain in location and severity.   He has noticed that joints on fingers of R hand have been swollen as well.  Borderline uric acid previously.     Also reports increased thirst and urination.    ROS:  A comprehensive ROS was completed and negative except as noted per HPI  No Known Allergies  Past Medical History:  Diagnosis Date  . Hypertension   . Low back pain     No past surgical history on file.  Social History   Socioeconomic History  . Marital status: Married    Spouse name: Not on file  . Number of children: Not on file  . Years of education: Not on file  . Highest education level: Not on file  Occupational History  . Not on file  Tobacco Use  . Smoking status: Never Smoker  . Smokeless tobacco: Never Used  Substance and Sexual Activity  . Alcohol use: Yes  . Drug use: No  . Sexual activity: Yes    Birth control/protection: None  Other Topics Concern  . Not on file  Social History Narrative  . Not on file   Social Determinants of Health   Financial Resource Strain:   . Difficulty of Paying Living Expenses: Not on file  Food Insecurity:   . Worried About Charity fundraiser in the Last Year: Not on file  . Ran Out of Food in the Last Year: Not on file  Transportation Needs:   . Lack of Transportation (Medical): Not on file  . Lack of Transportation (Non-Medical): Not on file   Physical Activity:   . Days of Exercise per Week: Not on file  . Minutes of Exercise per Session: Not on file  Stress:   . Feeling of Stress : Not on file  Social Connections:   . Frequency of Communication with Friends and Family: Not on file  . Frequency of Social Gatherings with Friends and Family: Not on file  . Attends Religious Services: Not on file  . Active Member of Clubs or Organizations: Not on file  . Attends Archivist Meetings: Not on file  . Marital Status: Not on file    Family History  Problem Relation Age of Onset  . Hypertension Father   . Hyperlipidemia Father   . Diabetes Sister     Health Maintenance  Topic Date Due  . HIV Screening  05/03/1998  . TETANUS/TDAP  05/03/2002  . INFLUENZA VACCINE  02/14/2019    ----------------------------------------------------------------------------------------------------------------------------------------------------------------------------------------------------------------- Physical Exam BP (!) 140/92   Pulse 89   Temp (!) 96.3 F (35.7 C)   Ht 5' 11.5" (1.816 m)   Wt 249 lb 9.6 oz (113.2 kg)   SpO2 97%   BMI 34.33 kg/m   Physical Exam Constitutional:      Appearance: Normal appearance. He is obese.  HENT:  Head: Normocephalic and atraumatic.     Mouth/Throat:     Mouth: Mucous membranes are moist.  Eyes:     General: No scleral icterus. Cardiovascular:     Rate and Rhythm: Normal rate and regular rhythm.  Pulmonary:     Effort: Pulmonary effort is normal.     Breath sounds: Normal breath sounds.  Musculoskeletal:     Comments: ROM of lumbar spine is limited in flexion.  Extension is pretty good.  He has ttp and spasm of bilateral paraspinals in lumbar spine.  Strength is normal.  Skin:    General: Skin is warm and dry.  Neurological:     General: No focal deficit present.     Mental Status: He is alert.     Gait: Gait normal.  Psychiatric:        Mood and Affect: Mood normal.         Behavior: Behavior normal.     ------------------------------------------------------------------------------------------------------------------------------------------------------------------------------------------------------------------- Assessment and Plan  Chronic low back pain Acute on chronic exacerbation of back pain.  History of bilateral pars defect.  Significant spasm noted on exam.  Tizanidine and tramadol re-ordered Toradol 60mg  given in clinic today Update xrays today   Polydipsia Check CMP and a1c  Arthralgia Recheck uric acid levels. Continue meloxicam as needed.     This visit occurred during the SARS-CoV-2 public health emergency.  Safety protocols were in place, including screening questions prior to the visit, additional usage of staff PPE, and extensive cleaning of exam room while observing appropriate contact time as indicated for disinfecting solutions.

## 2019-07-31 NOTE — Assessment & Plan Note (Signed)
Recheck uric acid levels. Continue meloxicam as needed.

## 2019-07-31 NOTE — Assessment & Plan Note (Signed)
Acute on chronic exacerbation of back pain.  History of bilateral pars defect.  Significant spasm noted on exam.  Tizanidine and tramadol re-ordered Toradol 60mg  given in clinic today Update xrays today

## 2019-07-31 NOTE — Assessment & Plan Note (Signed)
Check CMP and a1c

## 2019-07-31 NOTE — Patient Instructions (Signed)
Spondylolysis  Spondylolysis is a small break or crack (stress fracture) in a bone in the spine (vertebra) in the lower back (lumbar spine). The stress fracture occurs on the bony mass between and behind the vertebra. Spondylolysis may be caused by an injury (trauma) or by overuse. Since the lower back is almost always under pressure from daily living, this stress fracture usually does not heal normally. Spondylolysis may eventually cause one vertebra to slip forward and out of place (spondylolisthesis). What are the causes? This condition may be caused by:  Trauma, such as a fall.  Excessive wear and tear. This is often a result of doing sports or physical activities that involve repetitive overstretching (hyperextension) and rotation of the spine. What increases the risk?  You are more likely to develop this condition if you have: ? A family history of this condition. ? An inward curvature of your spine (lordosis). ? A condition that affects your spine, such as spina bifida. You are more likely to develop this condition if you participate in:  Gymnastics.  Dance.  Football.  Wrestling.  Martial arts.  Weight lifting.  Tennis.  Swimming. What are the signs or symptoms? Symptoms of this condition may include:  Long-lasting (chronic) pain in the lower back.  Stiffness in the back or legs.  Tightness in the hamstring muscles, which are in the backs of the thighs. In some cases, there may be no symptoms of this condition. How is this diagnosed? This condition may be diagnosed based on:  Your symptoms.  Your medical history.  A physical exam.  Imaging tests, such as: ? X-rays. ? CT scan. ? MRI. How is this treated? This condition may be treated by:  Resting. You may be asked to avoid or modify activities that put strain on your back until your symptoms improve.  Medicines to help relieve pain.  NSAIDs to help reduce swelling and discomfort.  Injections of  medicine (cortisone) in your back. These injections can help to relieve pain and numbness.  A brace to stabilize and support your back.  Physical therapy. You may work with an occupational therapist or physical therapist who can teach you how to reduce pressure on your back while you do everyday activities.  Surgery. This may be needed if you have: ? A severe injury. ? Pain that lasts for more than 6 months. ? Numbness in your pelvic region. ? Changes in control of your stool or urine. Follow these instructions at home: Medicines  Take over-the-counter and prescription medicines only as told by your health care provider.  Ask your health care provider if the medicine prescribed to you: ? Requires you to avoid driving or using heavy machinery. ? Can cause constipation. You may need to take these actions to prevent or treat constipation:  Drink enough fluid to keep your urine pale yellow.  Take over-the-counter or prescription medicines.  Eat foods that are high in fiber, such as beans, whole grains, and fresh fruits and vegetables.  Limit foods that are high in fat and processed sugars, such as fried or sweet foods. If you have a brace:  Wear the brace as told by your health care provider. Remove it only as told by your health care provider.  Keep the brace clean.  If the brace is not waterproof: ? Do not let it get wet. ? Cover it with a watertight covering when you take a bath or a shower. Activity  Rest and return to your normal activities as told   by your health care provider. Ask your health care provider what activities are safe for you.  Ask your health care provider when it is safe to drive if you have a back brace.  Work with a physical therapist to make a safe exercise program, as recommended by your health care provider. Do exercises as told by your physical therapist. This may include exercises to strengthen your back and abdominal muscles (core  exercises). Managing pain, stiffness, and swelling      If directed, put ice on the affected area. ? If you have a removable brace, remove it as told by your health care provider. ? Put ice in a plastic bag. ? Place a towel between your skin and the bag. ? Leave the ice on for 20 minutes, 2-3 times a day.  If directed, apply heat to the affected area as often as told by your health care provider. Use the heat source that your health care provider recommends, such as a moist heat pack or a heating pad. ? If you have a removable brace, remove it as told by your health care provider. ? Place a towel between your skin and the heat source. ? Leave the heat on for 20-30 minutes. ? Remove the heat if your skin turns bright red. This is especially important if you are unable to feel pain, heat, or cold. You may have a greater risk of getting burned. General instructions  Do not use any products that contain nicotine or tobacco, such as cigarettes, e-cigarettes, and chewing tobacco. These can delay bone healing. If you need help quitting, ask your health care provider.  Maintain a healthy weight. Extra weight puts stress on your back.  Keep all follow-up visits as told by your health care provider. This is important. Contact a health care provider if:  You have pain that gets worse or does not get better. Get help right away if:  You have severe back pain.  You have changes in control of your stool or urine.  You develop weakness or numbness in your legs.  You are unable to stand or walk. Summary  Spondylolysis is a small break or crack (stress fracture) in a bone in the spine (vertebra) in the lower back (lumbar spine).  This condition may be treated by resting, medicines, physical therapy, wearing a brace, or surgery.  Rest and return to your normal activities as told by your health care provider. Ask your health care provider what activities are safe for you.  Contact a health  care provider if you have pain that gets worse or does not get better. This information is not intended to replace advice given to you by your health care provider. Make sure you discuss any questions you have with your health care provider. Document Revised: 10/23/2018 Document Reviewed: 02/04/2018 Elsevier Patient Education  2020 [employer]  

## 2019-08-27 ENCOUNTER — Encounter: Payer: Self-pay | Admitting: Family Medicine

## 2019-08-28 ENCOUNTER — Ambulatory Visit (INDEPENDENT_AMBULATORY_CARE_PROVIDER_SITE_OTHER): Payer: BC Managed Care – PPO | Admitting: Family Medicine

## 2019-08-28 ENCOUNTER — Other Ambulatory Visit: Payer: Self-pay

## 2019-08-28 ENCOUNTER — Encounter: Payer: Self-pay | Admitting: Family Medicine

## 2019-08-28 VITALS — BP 174/103 | HR 102 | Temp 97.8°F | Ht 71.5 in | Wt 255.0 lb

## 2019-08-28 DIAGNOSIS — M25572 Pain in left ankle and joints of left foot: Secondary | ICD-10-CM | POA: Diagnosis not present

## 2019-08-28 DIAGNOSIS — I10 Essential (primary) hypertension: Secondary | ICD-10-CM | POA: Diagnosis not present

## 2019-08-28 MED ORDER — MELOXICAM 15 MG PO TABS: 15.0000 mg | ORAL_TABLET | Freq: Every day | ORAL | 1 refills | Status: DC

## 2019-08-28 NOTE — Assessment & Plan Note (Addendum)
May be coming from peroneal tendinopathy.  Does not seem intra-articular.  I think gout is less likely.   Will have him restart meloxicam, rx ordered Recommend icing a few times per day.  Referral to sports medicine as he may benefit from orthotics.

## 2019-08-28 NOTE — Patient Instructions (Signed)
Restart meloxicam I have entered an order for sports medicine Have FMLA paperwork sent over and I'll get it completed.

## 2019-08-28 NOTE — Assessment & Plan Note (Signed)
BP elevated today, possibly due to acute pain.  Home readings have been normal.

## 2019-08-28 NOTE — Progress Notes (Signed)
James Mayer - 39 y.o. male MRN [id number]  Date of birth: Jul 18, 1980  Subjective Chief Complaint  Patient presents with  . Ankle Pain    HPI James Mayer is a 39 y.o. male with history of chronic low back pain here today with complaint of L ankle pain.  Reports that pain started a few days ago.  He had been at work and got home that evening and was walking around the house when the ankle started to bother him. He denies known injury. It is now painful to walk.  He has had some mild swelling.  He denies warmth or redness.  He is out of meloxicam but tried tramadol and didn't get much relief.  The only change he reports is that he is walking and on his feet more at work.   ROS:  A comprehensive ROS was completed and negative except as noted per HPI  No Known Allergies  Past Medical History:  Diagnosis Date  . Hypertension   . Low back pain     History reviewed. No pertinent surgical history.  Social History   Socioeconomic History  . Marital status: Married    Spouse name: Not on file  . Number of children: Not on file  . Years of education: Not on file  . Highest education level: Not on file  Occupational History  . Not on file  Tobacco Use  . Smoking status: Never Smoker  . Smokeless tobacco: Never Used  Substance and Sexual Activity  . Alcohol use: Yes  . Drug use: No  . Sexual activity: Yes    Birth control/protection: None  Other Topics Concern  . Not on file  Social History Narrative  . Not on file   Social Determinants of Health   Financial Resource Strain:   . Difficulty of Paying Living Expenses: Not on file  Food Insecurity:   . Worried About Charity fundraiser in the Last Year: Not on file  . Ran Out of Food in the Last Year: Not on file  Transportation Needs:   . Lack of Transportation (Medical): Not on file  . Lack of Transportation (Non-Medical): Not on file  Physical Activity:   . Days of Exercise per Week: Not on file  . Minutes of  Exercise per Session: Not on file  Stress:   . Feeling of Stress : Not on file  Social Connections:   . Frequency of Communication with Friends and Family: Not on file  . Frequency of Social Gatherings with Friends and Family: Not on file  . Attends Religious Services: Not on file  . Active Member of Clubs or Organizations: Not on file  . Attends Archivist Meetings: Not on file  . Marital Status: Not on file    Family History  Problem Relation Age of Onset  . Hypertension Father   . Hyperlipidemia Father   . Diabetes Sister     Health Maintenance  Topic Date Due  . HIV Screening  05/03/1998  . TETANUS/TDAP  05/03/2002  . INFLUENZA VACCINE  10/14/2019 (Originally 02/14/2019)    ----------------------------------------------------------------------------------------------------------------------------------------------------------------------------------------------------------------- Physical Exam BP (!) 174/103   Pulse (!) 102   Temp 97.8 F (36.6 C) (Oral)   Ht 5' 11.5" (1.816 m)   Wt 255 lb (115.7 kg)   BMI 35.07 kg/m   Physical Exam Constitutional:      Appearance: Normal appearance.  HENT:     Head: Normocephalic and atraumatic.  Musculoskeletal:  Comments: Mild swelling over lateral ankle without erythema or warmth.  Pain with forced inversion resisted eversion along lateral ankle   He does have a fairly flat foot with some supination with walking.    Skin:    General: Skin is warm and dry.  Neurological:     General: No focal deficit present.     Mental Status: He is alert and oriented to person, place, and time.  Psychiatric:        Mood and Affect: Mood normal.        Behavior: Behavior normal.     ------------------------------------------------------------------------------------------------------------------------------------------------------------------------------------------------------------------- Assessment and Plan  Acute left  ankle pain May be coming from peroneal tendinopathy.  Does not seem intra-articular.  I think gout is less likely.   Will have him restart meloxicam, rx ordered Recommend icing a few times per day.  Referral to sports medicine as he may benefit from orthotics.    Essential hypertension BP elevated today, possibly due to acute pain.  Home readings have been normal.     This visit occurred during the SARS-CoV-2 public health emergency.  Safety protocols were in place, including screening questions prior to the visit, additional usage of staff PPE, and extensive cleaning of exam room while observing appropriate contact time as indicated for disinfecting solutions.

## 2019-09-03 ENCOUNTER — Ambulatory Visit: Payer: BC Managed Care – PPO | Admitting: Family Medicine

## 2019-09-08 ENCOUNTER — Ambulatory Visit: Payer: BC Managed Care – PPO | Admitting: Family Medicine

## 2019-09-09 DIAGNOSIS — J111 Influenza due to unidentified influenza virus with other respiratory manifestations: Secondary | ICD-10-CM | POA: Diagnosis not present

## 2019-09-09 DIAGNOSIS — Z1159 Encounter for screening for other viral diseases: Secondary | ICD-10-CM | POA: Diagnosis not present

## 2019-09-09 DIAGNOSIS — Z20822 Contact with and (suspected) exposure to covid-19: Secondary | ICD-10-CM | POA: Diagnosis not present

## 2019-09-11 ENCOUNTER — Ambulatory Visit: Payer: BC Managed Care – PPO | Admitting: Family Medicine

## 2019-10-09 ENCOUNTER — Ambulatory Visit: Payer: BC Managed Care – PPO

## 2019-10-09 DIAGNOSIS — Z03818 Encounter for observation for suspected exposure to other biological agents ruled out: Secondary | ICD-10-CM | POA: Diagnosis not present

## 2019-10-12 DIAGNOSIS — U071 COVID-19: Secondary | ICD-10-CM | POA: Diagnosis not present

## 2019-10-12 DIAGNOSIS — Z03818 Encounter for observation for suspected exposure to other biological agents ruled out: Secondary | ICD-10-CM | POA: Diagnosis not present

## 2019-10-16 ENCOUNTER — Other Ambulatory Visit: Payer: Self-pay

## 2019-10-16 ENCOUNTER — Encounter (HOSPITAL_COMMUNITY): Payer: Self-pay

## 2019-10-16 ENCOUNTER — Emergency Department (HOSPITAL_COMMUNITY): Payer: No Typology Code available for payment source

## 2019-10-16 ENCOUNTER — Emergency Department (HOSPITAL_COMMUNITY)
Admission: EM | Admit: 2019-10-16 | Discharge: 2019-10-16 | Disposition: A | Discharge status: Home / Self Care | Payer: No Typology Code available for payment source | Attending: Emergency Medicine | Admitting: Emergency Medicine

## 2019-10-16 DIAGNOSIS — U071 COVID-19: Secondary | ICD-10-CM | POA: Diagnosis not present

## 2019-10-16 DIAGNOSIS — R509 Fever, unspecified: Secondary | ICD-10-CM

## 2019-10-16 DIAGNOSIS — Z79899 Other long term (current) drug therapy: Secondary | ICD-10-CM | POA: Insufficient documentation

## 2019-10-16 DIAGNOSIS — I10 Essential (primary) hypertension: Secondary | ICD-10-CM | POA: Insufficient documentation

## 2019-10-16 LAB — BASIC METABOLIC PANEL
Anion gap: 13 (ref 5–15)
BUN: 17 mg/dL (ref 6–20)
CO2: 24 mmol/L (ref 22–32)
Calcium: 9.1 mg/dL (ref 8.9–10.3)
Chloride: 96 mmol/L — ABNORMAL LOW (ref 98–111)
Creatinine, Ser: 1.22 mg/dL (ref 0.61–1.24)
GFR calc Af Amer: >60 mL/min (ref >60)
GFR calc non Af Amer: >60 mL/min (ref >60)
Glucose, Bld: 158 mg/dL — ABNORMAL HIGH (ref 70–99)
Potassium: 3.5 mmol/L (ref 3.5–5.1)
Sodium: 133 mmol/L — ABNORMAL LOW (ref 135–145)

## 2019-10-16 LAB — CBC WITH DIFFERENTIAL/PLATELET
Abs Immature Granulocytes: 0.05 10*3/uL (ref 0.00–0.07)
Basophils Absolute: 0 10*3/uL (ref 0.0–0.1)
Basophils Relative: 0 %
Eosinophils Absolute: 0 10*3/uL (ref 0.0–0.5)
Eosinophils Relative: 0 %
HCT: 38.2 % — ABNORMAL LOW (ref 39.0–52.0)
Hemoglobin: 13.2 g/dL (ref 13.0–17.0)
Immature Granulocytes: 1 %
Lymphocytes Relative: 14 %
Lymphs Abs: 1.1 10*3/uL (ref 0.7–4.0)
MCH: 28.7 pg (ref 26.0–34.0)
MCHC: 34.6 g/dL (ref 30.0–36.0)
MCV: 83 fL (ref 80.0–100.0)
Monocytes Absolute: 0.4 10*3/uL (ref 0.1–1.0)
Monocytes Relative: 5 %
Neutro Abs: 6.4 10*3/uL (ref 1.7–7.7)
Neutrophils Relative %: 80 %
Platelets: 198 10*3/uL (ref 150–400)
RBC: 4.6 MIL/uL (ref 4.22–5.81)
RDW: 13.2 % (ref 11.5–15.5)
WBC: 7.9 10*3/uL (ref 4.0–10.5)
nRBC: 0 % (ref 0.0–0.2)

## 2019-10-16 LAB — LACTIC ACID, PLASMA: Lactic Acid, Venous: 2.1 mmol/L (ref 0.5–1.9)

## 2019-10-16 NOTE — Discharge Instructions (Addendum)
Continue Tylenol and/or ibuprofen for control of fever.   Return to the emergency department with any new or worsening symptoms, especially shortness of breath.

## 2019-10-16 NOTE — ED Triage Notes (Signed)
Patient arrived stating that he had a fever of 103.5 at home. Temperature 99.1 upon arrival. Patient states he took his medication last around 1015pm. Reports mild flu like symptoms states his wife wanted him evaluated.

## 2019-10-16 NOTE — ED Notes (Signed)
Date and time results received: 10/16/19 0308 (use smartphrase ".now" to insert current time)  Test:lactic acid Critical Value: 2.1  Name of Provider Notified:Palumbo Md  Orders Received? Or Actions Taken?:waiting on orders

## 2019-10-16 NOTE — ED Provider Notes (Signed)
[facility]-EMERGENCY DEPT Provider Note   CSN: [id number] Arrival date & time: 10/16/19  0020     History Chief Complaint  Patient presents with  . Fever    COVID POS    James Mayer is a 39 y.o. male.  Patient to ED for evaluation of fever with Tmax at home of 103 tonight. He reports several days of "flu like" symptoms including body aches, mild cough, mild fever. He tested positive for COVID 4 days ago. He states his fever was the highest it has been tonight and his wife felt he needed to be evaluated. He denies significant SOB, "only when I have to chase my 37 year old". No chest pain.   The history is provided by the patient. No language interpreter was used.  Fever Associated symptoms: cough and myalgias   Associated symptoms: no chest pain, no chills, no congestion, no nausea, no sore throat and no vomiting        Past Medical History:  Diagnosis Date  . Hypertension   . Low back pain     Patient Active Problem List   Diagnosis Date Noted  . Acute left ankle pain 08/28/2019  . Polydipsia 07/31/2019  . Arthralgia 04/14/2019  . Diarrhea 04/14/2019  . Essential hypertension 08/26/2018  . Chronic low back pain 08/26/2018    History reviewed. No pertinent surgical history.     Family History  Problem Relation Age of Onset  . Hypertension Father   . Hyperlipidemia Father   . Diabetes Sister     Social History   Tobacco Use  . Smoking status: Never Smoker  . Smokeless tobacco: Never Used  Substance Use Topics  . Alcohol use: Yes  . Drug use: No    Home Medications Prior to Admission medications   Medication Sig Start Date End Date Taking? Authorizing Provider  diphenoxylate-atropine (LOMOTIL) 2.5-0.025 MG tablet Take 1 tablet by mouth 4 (four) times daily as needed for diarrhea or loose stools. 04/14/19   Everrett Coombe, DO  hydrochlorothiazide (HYDRODIURIL) 25 MG tablet Take 25 mg by mouth daily.    [provider]   meloxicam (MOBIC) 15 MG tablet Take 1 tablet (15 mg total) by mouth daily. 08/28/19   Everrett Coombe, DO  tiZANidine (ZANAFLEX) 4 MG tablet Take 1 tablet (4 mg total) by mouth every 6 (six) hours as needed for muscle spasms. 07/31/19   Everrett Coombe, DO  traMADol (ULTRAM) 50 MG tablet Take 1 tablet (50 mg total) by mouth every 8 (eight) hours as needed for moderate pain. 07/31/19   Everrett Coombe, DO    Allergies    Patient has no known allergies.  Review of Systems   Review of Systems  Constitutional: Positive for fever. Negative for chills.  HENT: Negative.  Negative for congestion and sore throat.   Respiratory: Positive for cough.   Cardiovascular: Negative.  Negative for chest pain.  Gastrointestinal: Negative.  Negative for nausea and vomiting.  Musculoskeletal: Positive for myalgias.  Skin: Negative.   Neurological: Negative.     Physical Exam Updated Vital Signs BP (!) 171/86 (BP Location: Left Arm)   Pulse (!) 108   Temp 99.1 F (37.3 C) (Oral)   Resp 17   SpO2 97%   Physical Exam  ED Results / Procedures / Treatments   Labs (all labs ordered are listed, but only abnormal results are displayed) Labs Reviewed  SARS CORONAVIRUS 2 (TAT 6-24 HRS)  CBC WITH DIFFERENTIAL/PLATELET  LACTIC ACID, PLASMA  LACTIC ACID, PLASMA  BASIC METABOLIC PANEL    EKG None  Radiology DG Chest Portable 1 View  Result Date: 10/16/2019 CLINICAL DATA:  Cough and history of COVID-19 positivity EXAM: PORTABLE CHEST 1 VIEW COMPARISON:  None. FINDINGS: Cardiac shadows within normal limits. Lungs are well aerated bilaterally. Vague patchy opacities are noted within both lungs consistent with the given clinical history. No bony abnormality is noted. IMPRESSION: Vague patchy opacities throughout both lungs consistent with the given clinical history. Electronically Signed   By: Inez Catalina M.D.   On: 10/16/2019 01:50    Procedures Procedures (including critical care time)  Medications  Ordered in ED Medications - No data to display  ED Course  I have reviewed the triage vital signs and the nursing notes.  Pertinent labs & imaging results that were available during my care of the patient were reviewed by me and considered in my medical decision making (see chart for details).    MDM Rules/Calculators/A&P                      Patient to ED with known COVID infection, concerned for escalating fever.   He is well appearing. No hypoxia while in the ED, including with ambulation. CXR c/w COVID. No significant SOB or chest discomfort, doubt development of PE.   He is overall nontoxic in appearance. Active in the room. Speaking in full sentences. He can be discharged home with strict return precautions.   Final Clinical Impression(s) / ED Diagnoses Final diagnoses:  None   1. COVID infection 2. Fever  Rx / DC Orders ED Discharge Orders    None       Charlann Lange, Hershal Coria 10/16/19 [place], April, MD 10/16/19 [phone]

## 2019-10-23 ENCOUNTER — Telehealth (INDEPENDENT_AMBULATORY_CARE_PROVIDER_SITE_OTHER): Payer: No Typology Code available for payment source | Admitting: Medical-Surgical

## 2019-10-23 ENCOUNTER — Encounter: Payer: Self-pay | Admitting: Medical-Surgical

## 2019-10-23 VITALS — Temp 98.2°F | Wt 242.0 lb

## 2019-10-23 DIAGNOSIS — U071 COVID-19: Secondary | ICD-10-CM

## 2019-10-23 MED ORDER — PREDNISONE 50 MG PO TABS: 50.0000 mg | ORAL_TABLET | Freq: Every day | ORAL | 0 refills | Status: DC

## 2019-10-23 MED ORDER — HYDROCODONE-HOMATROPINE 5-1.5 MG/5ML PO SYRP: 5.0000 mL | ORAL_SOLUTION | Freq: Four times a day (QID) | ORAL | 0 refills | Status: DC | PRN

## 2019-10-23 NOTE — Progress Notes (Signed)
Virtual Visit via Video Note  I connected with James Mayer on 10/23/19 at  1:20 PM EDT by a video enabled telemedicine application and verified that I am speaking with the correct person using two identifiers.   I discussed the limitations of evaluation and management by telemedicine and the availability of in person appointments. The patient expressed understanding and agreed to proceed.  Subjective:    CC: Continuing Covid 19 symptoms  HPI: 39 year old male presenting via [employer] video visit for continued COVID-19 symptoms.  Positive Covid test 10/12/2019.  Evaluated in the emergency room on 10/16/2019, not admitted.  Completed a 3-day course dexamethasone.  Has been using Hycodan cough syrup, took his last dose last night and would like a refill.  Reports most symptoms have resolved but he continues to have a severe cough with intermittent shortness of breath and significant fatigue.  He also reports dizziness that interferes with his activities and his ability to drive.    Past medical history, Surgical history, Family history not pertinant except as noted below, Social history, Allergies, and medications have been entered into the medical record, reviewed, and corrections made.   Review of Systems: No fevers, chills, night sweats, weight loss, chest pain, or shortness of breath.   Objective:    General: Speaking clearly in complete sentences without any shortness of breath.  Alert and oriented x3.  Normal judgment. No apparent acute distress.  Impression and Recommendations:    COVID-19 infection Continue as needed Hycodan cough syrup, refill provided.  We will do 5-day burst of prednisone 50 mg if this helps resolve some of the dizziness and fatigue.  Discussed expectations for resolution of symptoms.  Patient understands that he may have residual fatigue and symptoms for several weeks.  Due to significant dizziness, giving work note to be out of work until 11/02/2019.  I discussed  the assessment and treatment plan with the patient. The patient was provided an opportunity to ask questions and all were answered. The patient agreed with the plan and demonstrated an understanding of the instructions.   The patient was advised to call back or seek an in-person evaluation if the symptoms worsen or if the condition fails to improve as anticipated.  Return if symptoms worsen or fail to improve.  20 minutes of non-face-to-face time was provided during this encounter.  Thayer Ohm, DNP, APRN, FNP-BC [facility] and Sports Medicine

## 2019-10-28 ENCOUNTER — Telehealth (INDEPENDENT_AMBULATORY_CARE_PROVIDER_SITE_OTHER): Payer: No Typology Code available for payment source | Admitting: Family Medicine

## 2019-10-28 ENCOUNTER — Encounter: Payer: Self-pay | Admitting: Family Medicine

## 2019-10-28 DIAGNOSIS — U071 COVID-19: Secondary | ICD-10-CM

## 2019-10-28 NOTE — Progress Notes (Signed)
James Mayer - 39 y.o. male MRN [id number]  Date of birth: 1981/06/03   This visit type was conducted due to national recommendations for restrictions regarding the COVID-19 Pandemic (e.g. social distancing).  This format is felt to be most appropriate for this patient at this time.  All issues noted in this document were discussed and addressed.  No physical exam was performed (except for noted visual exam findings with Video Visits).  I discussed the limitations of evaluation and management by telemedicine and the availability of in person appointments. The patient expressed understanding and agreed to proceed.  I connected with@ on 10/28/19 at 11:10 AM EDT by a video enabled telemedicine application and verified that I am speaking with the correct person using two identifiers.  Present at visit: James Coombe, DO Leitha Bleak   Patient Location: Home [address] [place] [address]   Provider location:   [facility]  Chief Complaint  Patient presents with  . Cough  . Fatigue    HPI  James Mayer is a 39 y.o. male who presents via audio/video conferencing for a telehealth visit today.  He is following up today for COVID infection.  He was diagnosed with COVID-19 on 10/16/19.  Started on 3 day course of dexamethasone.  Seen via VV last week by Christen Butter and was still having cough and shortness of breath.  He was started on prednisone course and prescribed cough syrup.  He reports symptoms improved with this but still has mild cough.  He also continues to have fatigue and shortness of breath with exertional activities.  He denies further fever, shortness of breath at rest, or chest pain.  His appetite is improving and he has been able to increase his fluid intake.    ROS:  A comprehensive ROS was completed and negative except as noted per HPI  Past Medical History:  Diagnosis Date  . Hypertension   . Low back pain     No past surgical history on file.  Family  History  Problem Relation Age of Onset  . Hypertension Father   . Hyperlipidemia Father   . Diabetes Sister     Social History   Socioeconomic History  . Marital status: Married    Spouse name: Not on file  . Number of children: Not on file  . Years of education: Not on file  . Highest education level: Not on file  Occupational History  . Not on file  Tobacco Use  . Smoking status: Never Smoker  . Smokeless tobacco: Never Used  Substance and Sexual Activity  . Alcohol use: Yes  . Drug use: No  . Sexual activity: Yes    Birth control/protection: None  Other Topics Concern  . Not on file  Social History Narrative  . Not on file   Social Determinants of Health   Financial Resource Strain:   . Difficulty of Paying Living Expenses:   Food Insecurity:   . Worried About Programme researcher, broadcasting/film/video in the Last Year:   . Barista in the Last Year:   Transportation Needs:   . Freight forwarder (Medical):   Marland Kitchen Lack of Transportation (Non-Medical):   Physical Activity:   . Days of Exercise per Week:   . Minutes of Exercise per Session:   Stress:   . Feeling of Stress :   Social Connections:   . Frequency of Communication with Friends and Family:   . Frequency of Social Gatherings  with Friends and Family:   . Attends Religious Services:   . Active Member of Clubs or Organizations:   . Attends Archivist Meetings:   Marland Kitchen Marital Status:   Intimate Partner Violence:   . Fear of Current or Ex-Partner:   . Emotionally Abused:   Marland Kitchen Physically Abused:   . Sexually Abused:      Current Outpatient Medications:  .  hydrochlorothiazide (HYDRODIURIL) 25 MG tablet, Take 25 mg by mouth daily., Disp: , Rfl:  .  HYDROcodone-homatropine (HYCODAN) 5-1.5 MG/5ML syrup, Take 5 mLs by mouth every 6 (six) hours as needed for cough., Disp: 120 mL, Rfl: 0 .  VALERIAN ROOT PO, Take 1 capsule by mouth daily., Disp: , Rfl:   EXAM:  VITALS per patient if applicable: Wt 242 lb  (109.8 kg)   BMI 33.28 kg/m   GENERAL: alert, oriented, appears well and in no acute distress  HEENT: atraumatic, conjunttiva clear, no obvious abnormalities on inspection of external nose and ears  NECK: normal movements of the head and neck  LUNGS: on inspection no signs of respiratory distress, breathing rate appears normal, no obvious gross SOB, gasping or wheezing  CV: no obvious cyanosis  MS: moves all visible extremities without noticeable abnormality  PSYCH/NEURO: pleasant and cooperative, no obvious depression or anxiety, speech and thought processing grossly intact  ASSESSMENT AND PLAN:  Discussed the following assessment and plan:  COVID-19 Symptoms improving. Still with some cough and fatigue.  Discussed that these may linger for a few weeks.  Continue to rest and push fluids.   He may continue cough syrup as needed at night.  He plans to return to work on 4/19 but discussed if symptoms worsen to contact our clinic.     20 minutes spent including pre visit preparation, review of prior notes and labs, encounter with patient via video visit and same day documentation.  I discussed the assessment and treatment plan with the patient. The patient was provided an opportunity to ask questions and all were answered. The patient agreed with the plan and demonstrated an understanding of the instructions.   The patient was advised to call back or seek an in-person evaluation if the symptoms worsen or if the condition fails to improve as anticipated.    Luetta Nutting, DO

## 2019-10-28 NOTE — Progress Notes (Signed)
Patient reports some cough and normally dry and some may come up.  He has some fatigue and disorientation.  He is using cough syrup at bedtime.

## 2019-10-28 NOTE — Assessment & Plan Note (Signed)
Symptoms improving. Still with some cough and fatigue.  Discussed that these may linger for a few weeks.  Continue to rest and push fluids.   He may continue cough syrup as needed at night.  He plans to return to work on 4/19 but discussed if symptoms worsen to contact our clinic.

## 2019-12-25 DIAGNOSIS — S335XXA Sprain of ligaments of lumbar spine, initial encounter: Secondary | ICD-10-CM | POA: Diagnosis not present

## 2019-12-31 DIAGNOSIS — R531 Weakness: Secondary | ICD-10-CM | POA: Diagnosis not present

## 2019-12-31 DIAGNOSIS — M2560 Stiffness of unspecified joint, not elsewhere classified: Secondary | ICD-10-CM | POA: Diagnosis not present

## 2019-12-31 DIAGNOSIS — M545 Low back pain: Secondary | ICD-10-CM | POA: Diagnosis not present

## 2019-12-31 DIAGNOSIS — S335XXA Sprain of ligaments of lumbar spine, initial encounter: Secondary | ICD-10-CM | POA: Diagnosis not present

## 2020-01-01 DIAGNOSIS — R531 Weakness: Secondary | ICD-10-CM | POA: Diagnosis not present

## 2020-01-01 DIAGNOSIS — M545 Low back pain: Secondary | ICD-10-CM | POA: Diagnosis not present

## 2020-01-01 DIAGNOSIS — M2560 Stiffness of unspecified joint, not elsewhere classified: Secondary | ICD-10-CM | POA: Diagnosis not present

## 2020-01-01 DIAGNOSIS — S335XXA Sprain of ligaments of lumbar spine, initial encounter: Secondary | ICD-10-CM | POA: Diagnosis not present

## 2020-01-05 DIAGNOSIS — M545 Low back pain: Secondary | ICD-10-CM | POA: Diagnosis not present

## 2020-01-05 DIAGNOSIS — S335XXA Sprain of ligaments of lumbar spine, initial encounter: Secondary | ICD-10-CM | POA: Diagnosis not present

## 2020-01-05 DIAGNOSIS — R531 Weakness: Secondary | ICD-10-CM | POA: Diagnosis not present

## 2020-01-05 DIAGNOSIS — M2560 Stiffness of unspecified joint, not elsewhere classified: Secondary | ICD-10-CM | POA: Diagnosis not present

## 2020-01-06 DIAGNOSIS — M2560 Stiffness of unspecified joint, not elsewhere classified: Secondary | ICD-10-CM | POA: Diagnosis not present

## 2020-01-06 DIAGNOSIS — M545 Low back pain: Secondary | ICD-10-CM | POA: Diagnosis not present

## 2020-01-06 DIAGNOSIS — R531 Weakness: Secondary | ICD-10-CM | POA: Diagnosis not present

## 2020-01-06 DIAGNOSIS — S335XXA Sprain of ligaments of lumbar spine, initial encounter: Secondary | ICD-10-CM | POA: Diagnosis not present

## 2020-01-08 DIAGNOSIS — M545 Low back pain: Secondary | ICD-10-CM | POA: Diagnosis not present

## 2020-01-08 DIAGNOSIS — M2560 Stiffness of unspecified joint, not elsewhere classified: Secondary | ICD-10-CM | POA: Diagnosis not present

## 2020-01-08 DIAGNOSIS — S335XXA Sprain of ligaments of lumbar spine, initial encounter: Secondary | ICD-10-CM | POA: Diagnosis not present

## 2020-01-08 DIAGNOSIS — R531 Weakness: Secondary | ICD-10-CM | POA: Diagnosis not present

## 2020-01-11 ENCOUNTER — Encounter: Payer: Self-pay | Admitting: Family Medicine

## 2020-01-12 DIAGNOSIS — S335XXA Sprain of ligaments of lumbar spine, initial encounter: Secondary | ICD-10-CM | POA: Diagnosis not present

## 2020-01-12 DIAGNOSIS — R531 Weakness: Secondary | ICD-10-CM | POA: Diagnosis not present

## 2020-01-12 DIAGNOSIS — M2560 Stiffness of unspecified joint, not elsewhere classified: Secondary | ICD-10-CM | POA: Diagnosis not present

## 2020-01-12 DIAGNOSIS — M545 Low back pain: Secondary | ICD-10-CM | POA: Diagnosis not present

## 2020-01-14 DIAGNOSIS — M545 Low back pain: Secondary | ICD-10-CM | POA: Diagnosis not present

## 2020-01-14 DIAGNOSIS — S335XXA Sprain of ligaments of lumbar spine, initial encounter: Secondary | ICD-10-CM | POA: Diagnosis not present

## 2020-01-14 DIAGNOSIS — R531 Weakness: Secondary | ICD-10-CM | POA: Diagnosis not present

## 2020-01-14 DIAGNOSIS — M2560 Stiffness of unspecified joint, not elsewhere classified: Secondary | ICD-10-CM | POA: Diagnosis not present

## 2020-01-26 DIAGNOSIS — R531 Weakness: Secondary | ICD-10-CM | POA: Diagnosis not present

## 2020-01-26 DIAGNOSIS — M2560 Stiffness of unspecified joint, not elsewhere classified: Secondary | ICD-10-CM | POA: Diagnosis not present

## 2020-01-26 DIAGNOSIS — M545 Low back pain: Secondary | ICD-10-CM | POA: Diagnosis not present

## 2020-01-26 DIAGNOSIS — S335XXA Sprain of ligaments of lumbar spine, initial encounter: Secondary | ICD-10-CM | POA: Diagnosis not present

## 2020-01-27 ENCOUNTER — Encounter: Payer: Self-pay | Admitting: Family Medicine

## 2020-01-27 ENCOUNTER — Ambulatory Visit (INDEPENDENT_AMBULATORY_CARE_PROVIDER_SITE_OTHER): Payer: BC Managed Care – PPO | Admitting: Family Medicine

## 2020-01-27 DIAGNOSIS — M545 Low back pain, unspecified: Secondary | ICD-10-CM

## 2020-01-27 MED ORDER — METHYLPREDNISOLONE 4 MG PO TBPK: ORAL_TABLET | ORAL | 0 refills | Status: DC

## 2020-01-27 MED ORDER — TRAMADOL HCL 50 MG PO TABS: 50.0000 mg | ORAL_TABLET | Freq: Three times a day (TID) | ORAL | 1 refills | Status: DC | PRN

## 2020-01-27 MED ORDER — TIZANIDINE HCL 4 MG PO TABS: 4.0000 mg | ORAL_TABLET | Freq: Four times a day (QID) | ORAL | 0 refills | Status: DC | PRN

## 2020-01-27 NOTE — Assessment & Plan Note (Signed)
Acute exacerbation of chronic back pain.  Start taper of methylprednisolone, Continue meloxicam and tizanidine as needed.  He has tramadol as needed as well.  Continue with PT.  If this pain persists we may need to repeat MRI.

## 2020-01-27 NOTE — Progress Notes (Signed)
MIGUELANGEL KORN - 38 y.o. male MRN [id number]  Date of birth: 03-03-1981  Subjective No chief complaint on file.   HPI DEMARRI ELIE is a 39 y.o. male with history of HTN and  chronic low back pain here today for acute flare of back pain.  He reports that since May his back has been giving him issues. He actually saw another provider in June and was referred to PT.  He is going to [facility] and had been getting some improvement until recently when he started having increased pain.  He has responded well to steroid previously.  He also has meloxicam, tramadol and tizanidine as needed.  He denies radiation of pain at this time, weakness, numbness or tingling.    ROS:  A comprehensive ROS was completed and negative except as noted per HPI  No Known Allergies  Past Medical History:  Diagnosis Date  . Hypertension   . Low back pain     History reviewed. No pertinent surgical history.  Social History   Socioeconomic History  . Marital status: Married    Spouse name: Not on file  . Number of children: Not on file  . Years of education: Not on file  . Highest education level: Not on file  Occupational History  . Not on file  Tobacco Use  . Smoking status: Never Smoker  . Smokeless tobacco: Never Used  Substance and Sexual Activity  . Alcohol use: Yes  . Drug use: No  . Sexual activity: Yes    Birth control/protection: None  Other Topics Concern  . Not on file  Social History Narrative  . Not on file   Social Determinants of Health   Financial Resource Strain:   . Difficulty of Paying Living Expenses:   Food Insecurity:   . Worried About Programme researcher, broadcasting/film/video in the Last Year:   . Barista in the Last Year:   Transportation Needs:   . Freight forwarder (Medical):   Marland Kitchen Lack of Transportation (Non-Medical):   Physical Activity:   . Days of Exercise per Week:   . Minutes of Exercise per Session:   Stress:   . Feeling of Stress :   Social Connections:    . Frequency of Communication with Friends and Family:   . Frequency of Social Gatherings with Friends and Family:   . Attends Religious Services:   . Active Member of Clubs or Organizations:   . Attends Banker Meetings:   Marland Kitchen Marital Status:     Family History  Problem Relation Age of Onset  . Hypertension Father   . Hyperlipidemia Father   . Diabetes Sister     Health Maintenance  Topic Date Due  . COVID-19 Vaccine (1) Never done  . HIV Screening  Never done  . TETANUS/TDAP  Never done  . INFLUENZA VACCINE  02/14/2020  . Hepatitis C Screening  Completed     ----------------------------------------------------------------------------------------------------------------------------------------------------------------------------------------------------------------- Physical Exam There were no vitals taken for this visit.  Physical Exam Constitutional:      Appearance: Normal appearance.  HENT:     Head: Normocephalic and atraumatic.  Eyes:     General: No scleral icterus. Cardiovascular:     Rate and Rhythm: Normal rate and regular rhythm.  Musculoskeletal:        General: Tenderness (lumbar paraspinals. ) present. Normal range of motion.     Cervical back: Neck supple.  Neurological:     General: No focal deficit  present.     Mental Status: He is alert.  Psychiatric:        Mood and Affect: Mood normal.        Behavior: Behavior normal.     ------------------------------------------------------------------------------------------------------------------------------------------------------------------------------------------------------------------- Assessment and Plan  Acute low back pain Acute exacerbation of chronic back pain.  Start taper of methylprednisolone, Continue meloxicam and tizanidine as needed.  He has tramadol as needed as well.  Continue with PT.  If this pain persists we may need to repeat MRI.    Meds ordered this encounter   Medications  . traMADol (ULTRAM) 50 MG tablet    Sig: Take 1 tablet (50 mg total) by mouth every 8 (eight) hours as needed for moderate pain.    Dispense:  30 tablet    Refill:  1  . tiZANidine (ZANAFLEX) 4 MG tablet    Sig: Take 1 tablet (4 mg total) by mouth every 6 (six) hours as needed for muscle spasms.    Dispense:  30 tablet    Refill:  0  . methylPREDNISolone (MEDROL DOSEPAK) 4 MG TBPK tablet    Sig: Taper as directed on packaging    Dispense:  21 tablet    Refill:  0    No follow-ups on file.    This visit occurred during the SARS-CoV-2 public health emergency.  Safety protocols were in place, including screening questions prior to the visit, additional usage of staff PPE, and extensive cleaning of exam room while observing appropriate contact time as indicated for disinfecting solutions.

## 2020-01-27 NOTE — Patient Instructions (Signed)
Continue with PT.  Keep me updated on how you are feeling.  Start steroid.  You may use tramadol and tizanidine as needed.

## 2020-01-29 ENCOUNTER — Encounter: Payer: Self-pay | Admitting: Family Medicine

## 2020-01-29 DIAGNOSIS — M545 Low back pain: Secondary | ICD-10-CM | POA: Diagnosis not present

## 2020-01-29 DIAGNOSIS — R531 Weakness: Secondary | ICD-10-CM | POA: Diagnosis not present

## 2020-01-29 DIAGNOSIS — S335XXA Sprain of ligaments of lumbar spine, initial encounter: Secondary | ICD-10-CM | POA: Diagnosis not present

## 2020-01-29 DIAGNOSIS — M2560 Stiffness of unspecified joint, not elsewhere classified: Secondary | ICD-10-CM | POA: Diagnosis not present

## 2020-02-02 DIAGNOSIS — M545 Low back pain: Secondary | ICD-10-CM | POA: Diagnosis not present

## 2020-02-02 DIAGNOSIS — M2560 Stiffness of unspecified joint, not elsewhere classified: Secondary | ICD-10-CM | POA: Diagnosis not present

## 2020-02-02 DIAGNOSIS — R531 Weakness: Secondary | ICD-10-CM | POA: Diagnosis not present

## 2020-02-02 DIAGNOSIS — S335XXA Sprain of ligaments of lumbar spine, initial encounter: Secondary | ICD-10-CM | POA: Diagnosis not present

## 2020-02-04 DIAGNOSIS — S335XXA Sprain of ligaments of lumbar spine, initial encounter: Secondary | ICD-10-CM | POA: Diagnosis not present

## 2020-02-04 DIAGNOSIS — M545 Low back pain: Secondary | ICD-10-CM | POA: Diagnosis not present

## 2020-02-04 DIAGNOSIS — M2560 Stiffness of unspecified joint, not elsewhere classified: Secondary | ICD-10-CM | POA: Diagnosis not present

## 2020-02-04 DIAGNOSIS — R531 Weakness: Secondary | ICD-10-CM | POA: Diagnosis not present

## 2020-02-09 DIAGNOSIS — S335XXA Sprain of ligaments of lumbar spine, initial encounter: Secondary | ICD-10-CM | POA: Diagnosis not present

## 2020-02-09 DIAGNOSIS — R531 Weakness: Secondary | ICD-10-CM | POA: Diagnosis not present

## 2020-02-09 DIAGNOSIS — M2560 Stiffness of unspecified joint, not elsewhere classified: Secondary | ICD-10-CM | POA: Diagnosis not present

## 2020-02-09 DIAGNOSIS — M545 Low back pain: Secondary | ICD-10-CM | POA: Diagnosis not present

## 2020-02-11 DIAGNOSIS — M545 Low back pain: Secondary | ICD-10-CM | POA: Diagnosis not present

## 2020-02-11 DIAGNOSIS — S335XXA Sprain of ligaments of lumbar spine, initial encounter: Secondary | ICD-10-CM | POA: Diagnosis not present

## 2020-02-11 DIAGNOSIS — R531 Weakness: Secondary | ICD-10-CM | POA: Diagnosis not present

## 2020-02-11 DIAGNOSIS — M2560 Stiffness of unspecified joint, not elsewhere classified: Secondary | ICD-10-CM | POA: Diagnosis not present

## 2020-02-18 DIAGNOSIS — R531 Weakness: Secondary | ICD-10-CM | POA: Diagnosis not present

## 2020-02-18 DIAGNOSIS — S335XXA Sprain of ligaments of lumbar spine, initial encounter: Secondary | ICD-10-CM | POA: Diagnosis not present

## 2020-02-18 DIAGNOSIS — M545 Low back pain: Secondary | ICD-10-CM | POA: Diagnosis not present

## 2020-02-18 DIAGNOSIS — M2560 Stiffness of unspecified joint, not elsewhere classified: Secondary | ICD-10-CM | POA: Diagnosis not present

## 2020-02-20 DIAGNOSIS — Z1159 Encounter for screening for other viral diseases: Secondary | ICD-10-CM | POA: Diagnosis not present

## 2020-02-20 DIAGNOSIS — Z20822 Contact with and (suspected) exposure to covid-19: Secondary | ICD-10-CM | POA: Diagnosis not present

## 2020-02-23 DIAGNOSIS — M545 Low back pain: Secondary | ICD-10-CM | POA: Diagnosis not present

## 2020-02-23 DIAGNOSIS — M2560 Stiffness of unspecified joint, not elsewhere classified: Secondary | ICD-10-CM | POA: Diagnosis not present

## 2020-02-23 DIAGNOSIS — R531 Weakness: Secondary | ICD-10-CM | POA: Diagnosis not present

## 2020-02-23 DIAGNOSIS — S335XXA Sprain of ligaments of lumbar spine, initial encounter: Secondary | ICD-10-CM | POA: Diagnosis not present

## 2020-02-25 ENCOUNTER — Encounter: Payer: Self-pay | Admitting: Family Medicine

## 2020-02-25 DIAGNOSIS — S335XXA Sprain of ligaments of lumbar spine, initial encounter: Secondary | ICD-10-CM | POA: Diagnosis not present

## 2020-02-25 DIAGNOSIS — R454 Irritability and anger: Secondary | ICD-10-CM

## 2020-02-25 DIAGNOSIS — M2560 Stiffness of unspecified joint, not elsewhere classified: Secondary | ICD-10-CM | POA: Diagnosis not present

## 2020-02-25 DIAGNOSIS — M545 Low back pain: Secondary | ICD-10-CM | POA: Diagnosis not present

## 2020-02-25 DIAGNOSIS — R531 Weakness: Secondary | ICD-10-CM | POA: Diagnosis not present

## 2020-02-25 NOTE — Telephone Encounter (Signed)
Routing to covering provider. Referral pended for review.  

## 2020-02-28 ENCOUNTER — Other Ambulatory Visit: Payer: Self-pay | Admitting: Family Medicine

## 2020-03-02 ENCOUNTER — Other Ambulatory Visit: Payer: Self-pay | Admitting: Family Medicine

## 2020-03-03 DIAGNOSIS — S335XXA Sprain of ligaments of lumbar spine, initial encounter: Secondary | ICD-10-CM | POA: Diagnosis not present

## 2020-03-03 DIAGNOSIS — M2560 Stiffness of unspecified joint, not elsewhere classified: Secondary | ICD-10-CM | POA: Diagnosis not present

## 2020-03-03 DIAGNOSIS — M545 Low back pain: Secondary | ICD-10-CM | POA: Diagnosis not present

## 2020-03-03 DIAGNOSIS — R531 Weakness: Secondary | ICD-10-CM | POA: Diagnosis not present

## 2020-03-07 ENCOUNTER — Encounter: Payer: Self-pay | Admitting: Family Medicine

## 2020-03-10 ENCOUNTER — Other Ambulatory Visit: Payer: Self-pay

## 2020-03-10 MED ORDER — MELOXICAM 15 MG PO TABS: 15.0000 mg | ORAL_TABLET | Freq: Every day | ORAL | 0 refills | Status: DC

## 2020-03-24 DIAGNOSIS — S335XXA Sprain of ligaments of lumbar spine, initial encounter: Secondary | ICD-10-CM | POA: Diagnosis not present

## 2020-03-24 DIAGNOSIS — R531 Weakness: Secondary | ICD-10-CM | POA: Diagnosis not present

## 2020-03-24 DIAGNOSIS — M545 Low back pain: Secondary | ICD-10-CM | POA: Diagnosis not present

## 2020-03-24 DIAGNOSIS — M2560 Stiffness of unspecified joint, not elsewhere classified: Secondary | ICD-10-CM | POA: Diagnosis not present

## 2020-03-30 ENCOUNTER — Other Ambulatory Visit: Payer: Self-pay | Admitting: Nurse Practitioner

## 2020-03-30 ENCOUNTER — Encounter: Payer: Self-pay | Admitting: Nurse Practitioner

## 2020-03-30 DIAGNOSIS — R7303 Prediabetes: Secondary | ICD-10-CM

## 2020-04-04 ENCOUNTER — Ambulatory Visit (HOSPITAL_COMMUNITY): Payer: Self-pay | Admitting: Licensed Clinical Social Worker

## 2020-04-13 ENCOUNTER — Other Ambulatory Visit: Payer: Self-pay | Admitting: Family Medicine

## 2020-04-25 ENCOUNTER — Other Ambulatory Visit: Payer: Self-pay | Admitting: Family Medicine

## 2020-05-05 ENCOUNTER — Ambulatory Visit (HOSPITAL_COMMUNITY): Payer: BC Managed Care – PPO | Admitting: Licensed Clinical Social Worker

## 2020-05-13 ENCOUNTER — Other Ambulatory Visit: Payer: Self-pay | Admitting: Family Medicine

## 2020-06-14 ENCOUNTER — Other Ambulatory Visit: Payer: Self-pay | Admitting: Family Medicine

## 2020-06-23 ENCOUNTER — Other Ambulatory Visit: Payer: Self-pay | Admitting: Family Medicine

## 2020-07-18 ENCOUNTER — Other Ambulatory Visit: Payer: Self-pay | Admitting: Family Medicine

## 2020-07-27 ENCOUNTER — Other Ambulatory Visit: Payer: Self-pay | Admitting: Family Medicine

## 2020-08-22 ENCOUNTER — Telehealth (INDEPENDENT_AMBULATORY_CARE_PROVIDER_SITE_OTHER): Payer: BC Managed Care – PPO | Admitting: Family Medicine

## 2020-08-22 ENCOUNTER — Encounter: Payer: Self-pay | Admitting: Family Medicine

## 2020-08-22 DIAGNOSIS — M542 Cervicalgia: Secondary | ICD-10-CM | POA: Insufficient documentation

## 2020-08-22 DIAGNOSIS — J40 Bronchitis, not specified as acute or chronic: Secondary | ICD-10-CM | POA: Diagnosis not present

## 2020-08-22 DIAGNOSIS — G609 Hereditary and idiopathic neuropathy, unspecified: Secondary | ICD-10-CM | POA: Insufficient documentation

## 2020-08-22 DIAGNOSIS — M47812 Spondylosis without myelopathy or radiculopathy, cervical region: Secondary | ICD-10-CM | POA: Insufficient documentation

## 2020-08-22 MED ORDER — DOXYCYCLINE HYCLATE 100 MG PO TABS: 100.0000 mg | ORAL_TABLET | Freq: Two times a day (BID) | ORAL | 0 refills | Status: DC

## 2020-08-22 MED ORDER — PREDNISONE 20 MG PO TABS: 20.0000 mg | ORAL_TABLET | Freq: Two times a day (BID) | ORAL | 0 refills | Status: AC

## 2020-08-22 MED ORDER — ALBUTEROL SULFATE HFA 108 (90 BASE) MCG/ACT IN AERS: 2.0000 | INHALATION_SPRAY | Freq: Four times a day (QID) | RESPIRATORY_TRACT | 0 refills | Status: AC | PRN

## 2020-08-22 MED ORDER — TRAMADOL HCL 50 MG PO TABS: ORAL_TABLET | ORAL | 2 refills | Status: DC

## 2020-08-22 MED ORDER — MELOXICAM 15 MG PO TABS: 15.0000 mg | ORAL_TABLET | Freq: Every day | ORAL | 2 refills | Status: DC

## 2020-08-22 NOTE — Progress Notes (Signed)
Symptoms started x 2.5 wks  Meds: OTC decongestant & cough  Family (except pt) have been tested for COVID: all negative

## 2020-08-22 NOTE — Progress Notes (Signed)
James Mayer - 40 y.o. male MRN [id number]  Date of birth: [birth year]   This visit type was conducted due to national recommendations for restrictions regarding the COVID-19 Pandemic (e.g. social distancing).  This format is felt to be most appropriate for this patient at this time.  All issues noted in this document were discussed and addressed.  No physical exam was performed (except for noted visual exam findings with Video Visits).  I discussed the limitations of evaluation and management by telemedicine and the availability of in person appointments. The patient expressed understanding and agreed to proceed.  I connected with@ on 08/22/20 at 11:30 AM EST by a video enabled telemedicine application and verified that I am speaking with the correct person using two identifiers.  Present at visit: James Coombe, DO Leitha Bleak   Patient Location: Home [address] [place] [address]   Provider location:   [facility]  Chief Complaint  Patient presents with  . Cough  . Nasal Congestion  . Sore Throat    HPI  James Mayer is a 40 y.o. male who presents via audio/video conferencing for a telehealth visit today.  He has complaint of cough and congestion.  Symptoms began about 2.5 weeks ago.  He has felt fatigued as well.  He has noticed some wheezing. Sputum is green and chunky appearing. He denies fever, chills, nausea or vomiting, headache, shortness of breath.  He has taken multiple home COVID tests which were negative.  He has had COVID vaccine.  He is using OTC medications with little relief   ROS:  A comprehensive ROS was completed and negative except as noted per HPI  Past Medical History:  Diagnosis Date  . Hypertension   . Low back pain     History reviewed. No pertinent surgical history.  Family History  Problem Relation Age of Onset  . Hypertension Father   . Hyperlipidemia Father   . Diabetes Sister     Social History   Socioeconomic History   . Marital status: Married    Spouse name: Not on file  . Number of children: Not on file  . Years of education: Not on file  . Highest education level: Not on file  Occupational History  . Not on file  Tobacco Use  . Smoking status: Never Smoker  . Smokeless tobacco: Never Used  Substance and Sexual Activity  . Alcohol use: Yes  . Drug use: No  . Sexual activity: Yes    Birth control/protection: None  Other Topics Concern  . Not on file  Social History Narrative  . Not on file   Social Determinants of Health   Financial Resource Strain: Not on file  Food Insecurity: Not on file  Transportation Needs: Not on file  Physical Activity: Not on file  Stress: Not on file  Social Connections: Not on file  Intimate Partner Violence: Not on file     Current Outpatient Medications:  .  albuterol (VENTOLIN HFA) 108 (90 Base) MCG/ACT inhaler, Inhale 2 puffs into the lungs every 6 (six) hours as needed for wheezing or shortness of breath., Disp: 8 g, Rfl: 0 .  doxycycline (VIBRA-TABS) 100 MG tablet, Take 1 tablet (100 mg total) by mouth 2 (two) times daily., Disp: 20 tablet, Rfl: 0 .  hydrochlorothiazide (HYDRODIURIL) 25 MG tablet, Take 25 mg by mouth daily., Disp: , Rfl:  .  predniSONE (DELTASONE) 20 MG tablet, Take 1 tablet (20 mg total) by mouth 2 (two)  times daily with a meal for 5 days., Disp: 10 tablet, Rfl: 0 .  tiZANidine (ZANAFLEX) 4 MG tablet, TAKE ONE TABLET BY MOUTH EVERY 6 HOURS AS NEEDED FOR MUSCLE SPASMS, Disp: 30 tablet, Rfl: 0 .  VALERIAN ROOT PO, Take 1 capsule by mouth daily., Disp: , Rfl:  .  meloxicam (MOBIC) 15 MG tablet, Take 1 tablet (15 mg total) by mouth daily., Disp: 30 tablet, Rfl: 2 .  traMADol (ULTRAM) 50 MG tablet, TAKE ONE TABLET BY MOUTH EVERY 8 HOURS AS NEEDED FOR MODERATE PAIN, Disp: 30 tablet, Rfl: 2  EXAM:  VITALS per patient if applicable: Wt 245 lb (111.1 kg)   BMI 33.46 kg/m   GENERAL: alert, oriented, appears well and in no acute  distress  HEENT: atraumatic, conjunttiva clear, no obvious abnormalities on inspection of external nose and ears  NECK: normal movements of the head and neck  LUNGS: on inspection no signs of respiratory distress, breathing rate appears normal, no obvious gross SOB, gasping or wheezing  CV: no obvious cyanosis  MS: moves all visible extremities without noticeable abnormality  PSYCH/NEURO: pleasant and cooperative, no obvious depression or anxiety, speech and thought processing grossly intact  ASSESSMENT AND PLAN:  Discussed the following assessment and plan:  Bronchitis Prolonged symptoms.  Multiple negative COVID antigen tests.  Push fluids.  May continue OTC medications if needed.  Will treat with course of prednisone and doxycycline.  Albuterol inhaler as needed.  Instructed to contact clinic if not improving.       I discussed the assessment and treatment plan with the patient. The patient was provided an opportunity to ask questions and all were answered. The patient agreed with the plan and demonstrated an understanding of the instructions.   The patient was advised to call back or seek an in-person evaluation if the symptoms worsen or if the condition fails to improve as anticipated.    James Coombe, DO

## 2020-08-22 NOTE — Assessment & Plan Note (Addendum)
Prolonged symptoms.  Multiple negative COVID antigen tests.  Push fluids.  May continue OTC medications if needed.  Will treat with course of prednisone and doxycycline.  Albuterol inhaler as needed.  Instructed to contact clinic if not improving.

## 2020-09-07 IMAGING — DX DG LUMBAR SPINE COMPLETE 4+V
5 series · 5 of 5 positions shown · non-contrast
Comparison: CT 02/26/2009

CLINICAL DATA: Acute on chronic back pain

EXAM:
LUMBAR SPINE - COMPLETE 4+ VIEW

[lumbar spine ap]
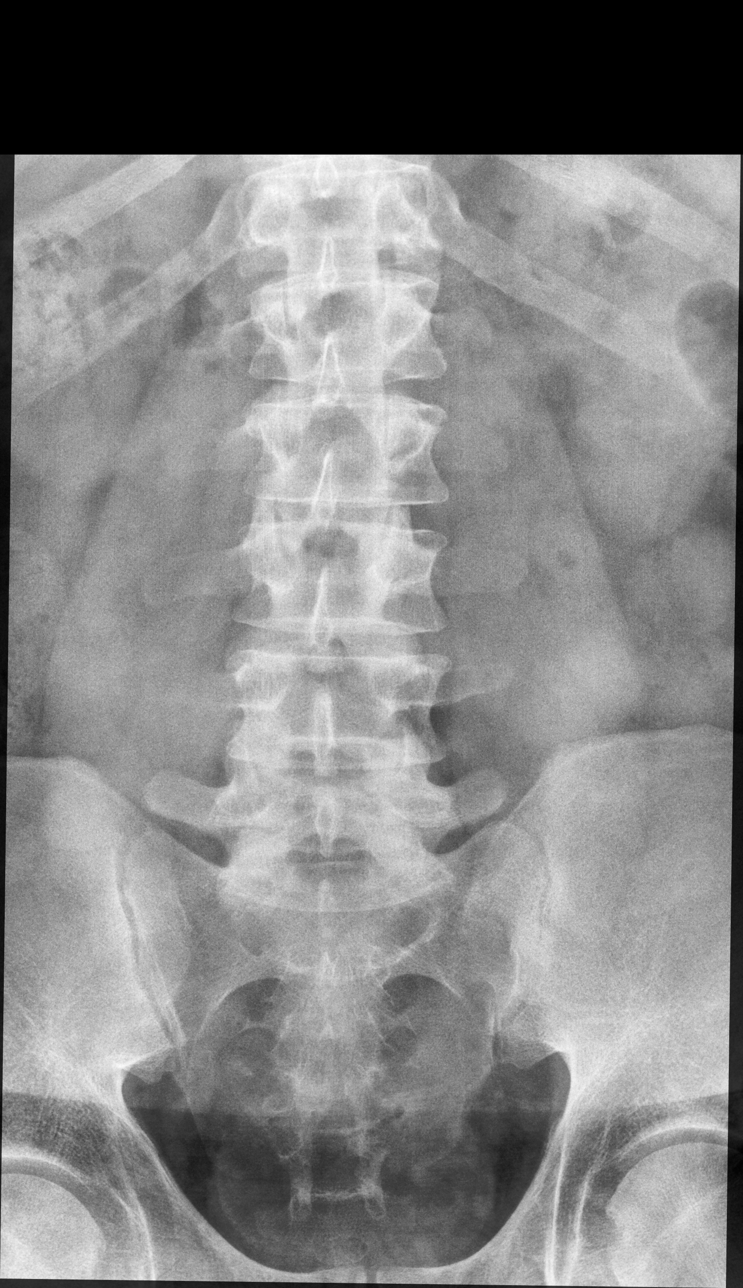

[lumbar spine lmo (1 of 2)]
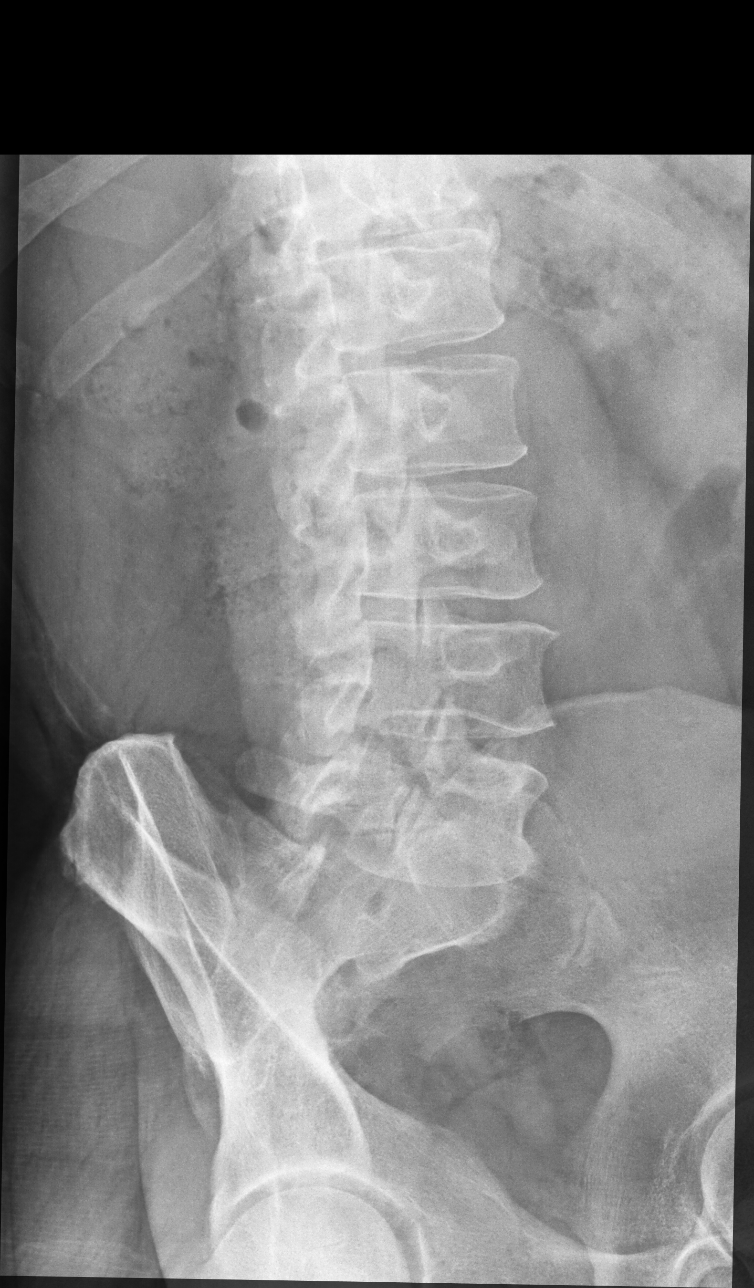

[lumbar spine lmo (2 of 2)]
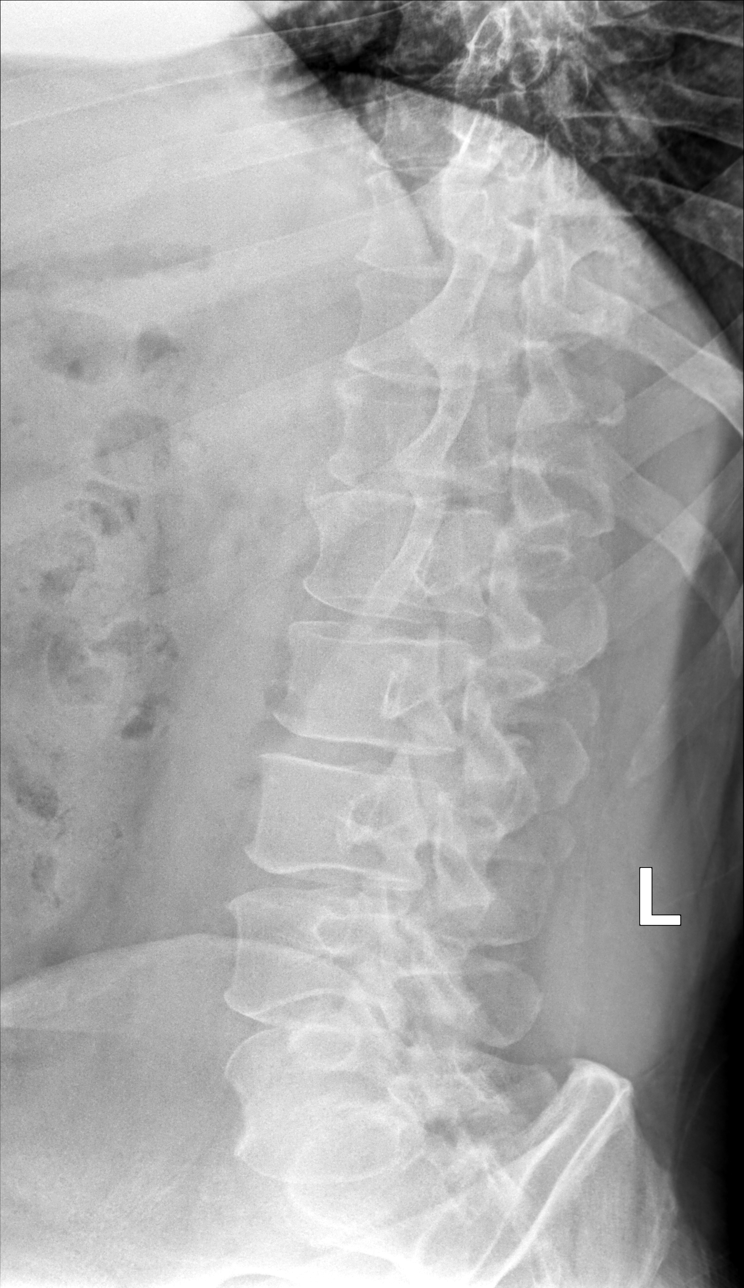

[lumbar spine lat (1 of 2)]
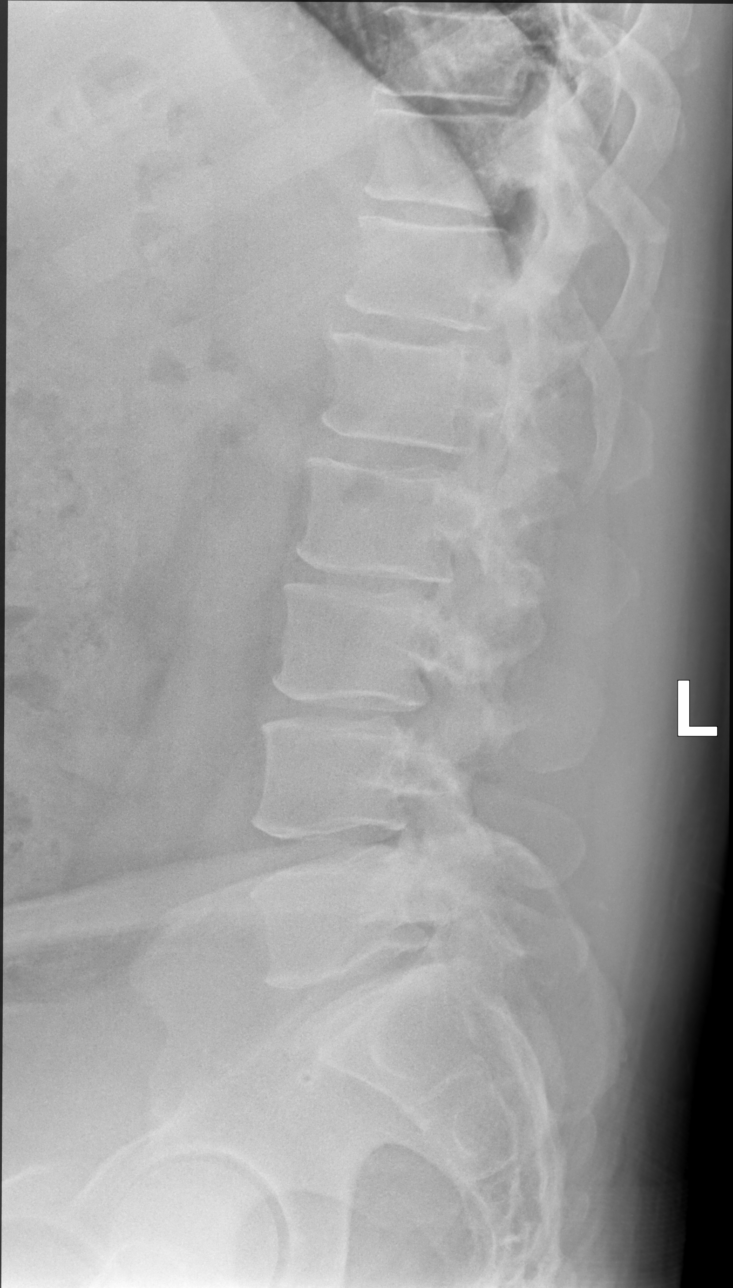

[lumbar spine lat (2 of 2)]
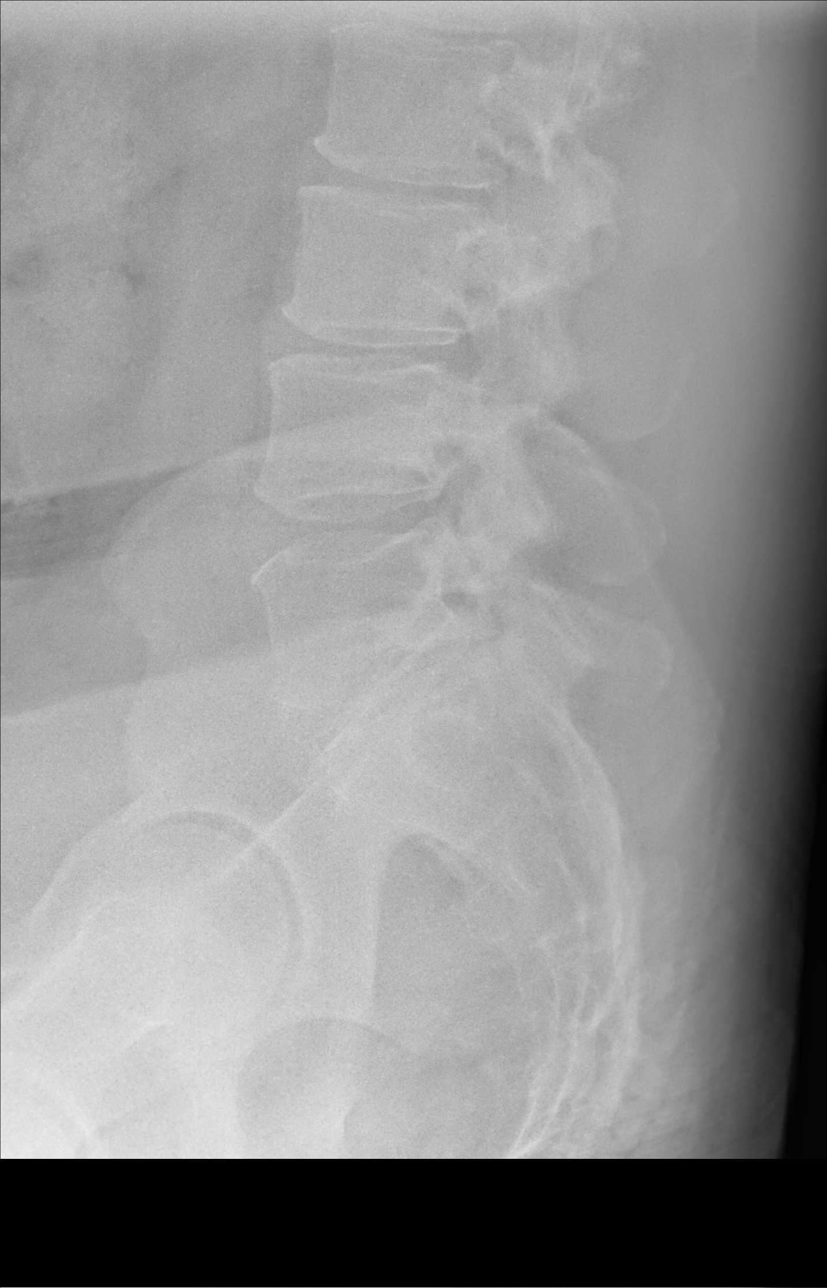

[5 of 5 positions shown; findings below may reference images not displayed]

FINDINGS: Chronic bilateral pars defect at L5. No significant listhesis. Mild
disc space narrowing at L5-S1 and L2-L3. Vertebral body heights are
maintained.
IMPRESSION: 1. No acute osseous abnormality
2. Chronic bilateral pars defect at L5 without significant
listhesis. Mild degenerative changes at L2-L3 and L5-S1

## 2020-09-14 ENCOUNTER — Telehealth: Payer: Self-pay

## 2020-09-14 NOTE — Telephone Encounter (Signed)
Patient dropped off paperwork for FMLA. He stated that he would like to be alerted in [place], and a phone message at [phone] when completed.

## 2020-09-16 NOTE — Telephone Encounter (Signed)
Form completed and placed in Panya's box

## 2020-10-31 ENCOUNTER — Other Ambulatory Visit: Payer: Self-pay | Admitting: Family Medicine

## 2020-11-22 ENCOUNTER — Other Ambulatory Visit: Payer: Self-pay | Admitting: Family Medicine

## 2020-11-23 IMAGING — DX DG CHEST 1V PORT
1 series · 1 of 1 positions shown · non-contrast
Comparison: None.

CLINICAL DATA: Cough and history of [id number] positivity

EXAM:
PORTABLE CHEST 1 VIEW

[chest ap]
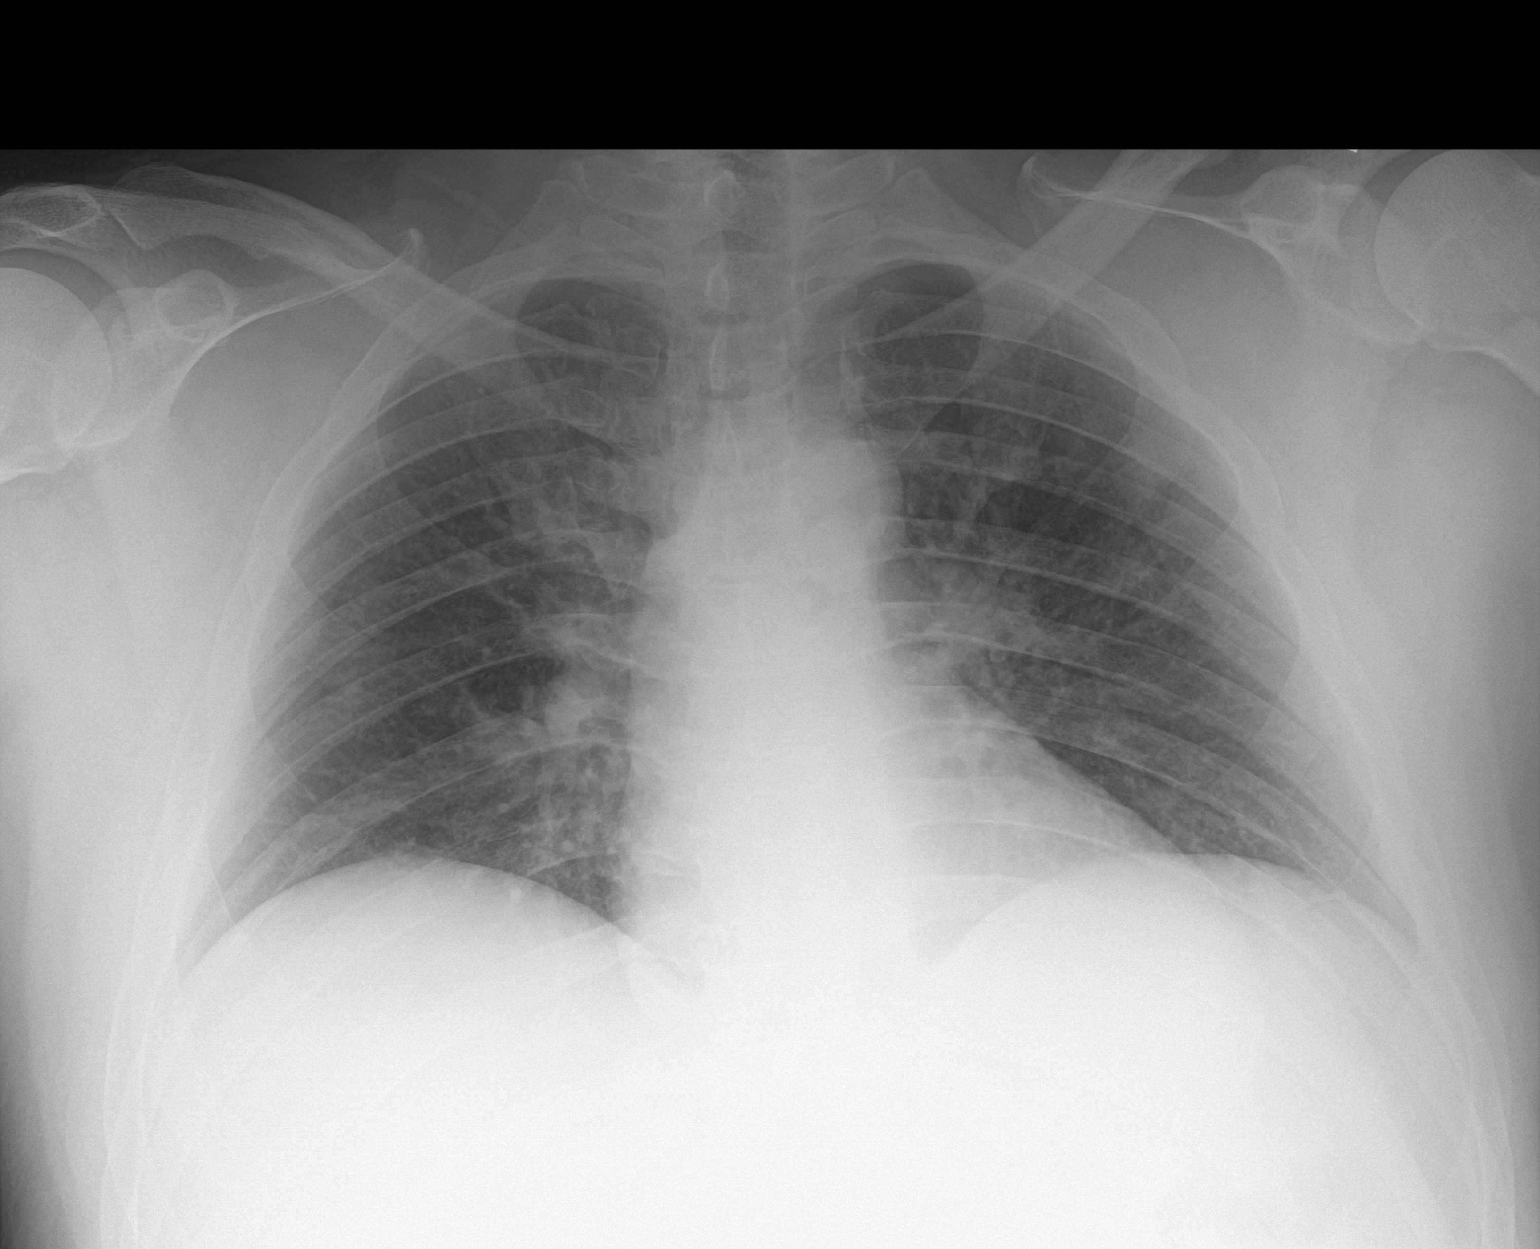

[1 of 1 positions shown; findings below may reference images not displayed]

FINDINGS: Cardiac shadows within normal limits. Lungs are well aerated
bilaterally. Vague patchy opacities are noted within both lungs
consistent with the given clinical history. No bony abnormality is
noted.
IMPRESSION: Vague patchy opacities throughout both lungs consistent with the
given clinical history.

## 2020-11-24 DIAGNOSIS — J1100 Influenza due to unidentified influenza virus with unspecified type of pneumonia: Secondary | ICD-10-CM | POA: Diagnosis not present

## 2020-11-24 DIAGNOSIS — Z20822 Contact with and (suspected) exposure to covid-19: Secondary | ICD-10-CM | POA: Diagnosis not present

## 2020-11-26 DIAGNOSIS — R030 Elevated blood-pressure reading, without diagnosis of hypertension: Secondary | ICD-10-CM | POA: Diagnosis not present

## 2020-11-26 DIAGNOSIS — F419 Anxiety disorder, unspecified: Secondary | ICD-10-CM | POA: Diagnosis not present

## 2020-11-26 DIAGNOSIS — J209 Acute bronchitis, unspecified: Secondary | ICD-10-CM | POA: Diagnosis not present

## 2020-11-26 DIAGNOSIS — Z20822 Contact with and (suspected) exposure to covid-19: Secondary | ICD-10-CM | POA: Diagnosis not present

## 2020-12-23 ENCOUNTER — Other Ambulatory Visit: Payer: Self-pay | Admitting: Family Medicine

## 2021-01-13 ENCOUNTER — Ambulatory Visit (INDEPENDENT_AMBULATORY_CARE_PROVIDER_SITE_OTHER): Payer: BC Managed Care – PPO | Admitting: Family Medicine

## 2021-01-13 ENCOUNTER — Other Ambulatory Visit: Payer: Self-pay

## 2021-01-13 ENCOUNTER — Encounter: Payer: Self-pay | Admitting: Family Medicine

## 2021-01-13 VITALS — BP 157/104 | HR 88 | Temp 98.3°F | Resp 20 | Ht 71.75 in | Wt 257.0 lb

## 2021-01-13 DIAGNOSIS — M545 Low back pain, unspecified: Secondary | ICD-10-CM

## 2021-01-13 DIAGNOSIS — I10 Essential (primary) hypertension: Secondary | ICD-10-CM

## 2021-01-13 DIAGNOSIS — R0683 Snoring: Secondary | ICD-10-CM | POA: Diagnosis not present

## 2021-01-13 DIAGNOSIS — G2581 Restless legs syndrome: Secondary | ICD-10-CM | POA: Diagnosis not present

## 2021-01-13 DIAGNOSIS — G8929 Other chronic pain: Secondary | ICD-10-CM

## 2021-01-13 MED ORDER — TRAMADOL HCL 50 MG PO TABS: ORAL_TABLET | ORAL | 2 refills | Status: DC

## 2021-01-13 MED ORDER — MELOXICAM 15 MG PO TABS: 15.0000 mg | ORAL_TABLET | Freq: Every day | ORAL | 6 refills | Status: DC

## 2021-01-13 MED ORDER — TIZANIDINE HCL 4 MG PO TABS: ORAL_TABLET | ORAL | 6 refills | Status: DC

## 2021-01-15 DIAGNOSIS — G2581 Restless legs syndrome: Secondary | ICD-10-CM | POA: Insufficient documentation

## 2021-01-15 NOTE — Assessment & Plan Note (Signed)
Blood pressure is elevated today.  Discussed taking medications consistently.  He will start checking some at home as well.  Discussed that if readings at home are elevated as well we will need to discuss addition of other medication.

## 2021-01-15 NOTE — Assessment & Plan Note (Signed)
Nonradicular low back pain. Continue meloxicam, tizanidine and tramadol as needed. Encouraged to stay active to limit back stiffness. FMLA paperwork completed.

## 2021-01-15 NOTE — Progress Notes (Signed)
James Mayer - 40 y.o. male MRN [id number]  Date of birth: [birth year]  Subjective Chief Complaint  Patient presents with   Insomnia    HPI James Mayer is a 40 year old male here today for a follow-up visit as well as to discuss sleeping difficulties.  James Mayer has a history of chronic nonradicular low back pain related to his previous [employer].  He has had multiple imaging studies as well as orthopedic consultation and physical therapy.  Currently he is controlling this with meloxicam as well as tramadol as needed.  He has difficulty standing and/or sitting for long periods of time while at work.  He does have flares of his back pain from time to time requiring him to miss work.  He does need an updated form completed for intermittent FMLA.  His other concern today is increased sleeping difficulties.  He does have some issues with falling asleep but once he is asleep he seems to sleep pretty well however his wife is complaining that he is quite restless at night and his legs thrash around while he is sleeping.  He does have some arm movement as well.  He does admit that he does not feel well rested in the morning.  He is seeing psychiatry through the [place] and is currently on Lexapro.  He denies any nightmares associated with his symptoms.  ROS:  A comprehensive ROS was completed and negative except as noted per HPI    No Known Allergies  Past Medical History:  Diagnosis Date   Hypertension    Low back pain     History reviewed. No pertinent surgical history.  Social History   Socioeconomic History   Marital status: Married    Spouse name: Not on file   Number of children: Not on file   Years of education: Not on file   Highest education level: Not on file  Occupational History   Not on file  Tobacco Use   Smoking status: Never   Smokeless tobacco: Never  Substance and Sexual Activity   Alcohol use: Yes   Drug use: No   Sexual activity: Yes    Birth control/protection:  None  Other Topics Concern   Not on file  Social History Narrative   Not on file   Social Determinants of Health   Financial Resource Strain: Not on file  Food Insecurity: Not on file  Transportation Needs: Not on file  Physical Activity: Not on file  Stress: Not on file  Social Connections: Not on file    Family History  Problem Relation Age of Onset   Hypertension Father    Hyperlipidemia Father    Diabetes Sister     Health Maintenance  Topic Date Due   Pneumococcal Vaccine 65-49 Years old (1 - PCV) Never done   HIV Screening  Never done   TETANUS/TDAP  07/16/2016   COVID-19 Vaccine (3 - Booster for Moderna series) 08/01/2020   INFLUENZA VACCINE  02/13/2021   Hepatitis C Screening  Completed   HPV VACCINES  Aged Out     ----------------------------------------------------------------------------------------------------------------------------------------------------------------------------------------------------------------- Physical Exam BP (!) 157/104 (BP Location: Right Arm, Patient Position: Sitting, Cuff Size: Large)   Pulse 88   Temp 98.3 F (36.8 C) (Oral)   Resp 20   Ht 5' 11.75" (1.822 m)   Wt 257 lb (116.6 kg)   SpO2 97%   BMI 35.10 kg/m   Physical Exam Constitutional:      Appearance: Normal appearance.  HENT:  Head: Normocephalic and atraumatic.  Eyes:     General: No scleral icterus. Cardiovascular:     Rate and Rhythm: Normal rate and regular rhythm.  Pulmonary:     Effort: Pulmonary effort is normal.     Breath sounds: Normal breath sounds.  Musculoskeletal:     Cervical back: Neck supple.  Neurological:     General: No focal deficit present.     Mental Status: He is alert.  Psychiatric:        Mood and Affect: Mood normal.        Behavior: Behavior normal.     ------------------------------------------------------------------------------------------------------------------------------------------------------------------------------------------------------------------- Assessment and Plan  Essential hypertension Blood pressure is elevated today.  Discussed taking medications consistently.  He will start checking some at home as well.  Discussed that if readings at home are elevated as well we will need to discuss addition of other medication.  Chronic low back pain Nonradicular low back pain. Continue meloxicam, tizanidine and tramadol as needed. Encouraged to stay active to limit back stiffness. FMLA paperwork completed.   Restless legs Noticed to have restless legs while sleeping as well as some increased snoring.  Referral placed to sleep study for sleep evaluation to see if he may have RLS versus OSA, Or combination of both   Meds ordered this encounter  Medications   traMADol (ULTRAM) 50 MG tablet    Sig: TAKE ONE TABLET BY MOUTH EVERY 8 HOURS AS NEEDED FOR MODERATE PAIN    Dispense:  30 tablet    Refill:  2   tiZANidine (ZANAFLEX) 4 MG tablet    Sig: TAKE ONE TABLET BY MOUTH EVERY 6 HOURS AS NEEDED FOR MUSCLE SPASMS    Dispense:  30 tablet    Refill:  6   meloxicam (MOBIC) 15 MG tablet    Sig: Take 1 tablet (15 mg total) by mouth daily.    Dispense:  30 tablet    Refill:  6    No follow-ups on file.    This visit occurred during the SARS-CoV-2 public health emergency.  Safety protocols were in place, including screening questions prior to the visit, additional usage of staff PPE, and extensive cleaning of exam room while observing appropriate contact time as indicated for disinfecting solutions.

## 2021-01-15 NOTE — Assessment & Plan Note (Signed)
Noticed to have restless legs while sleeping as well as some increased snoring.  Referral placed to sleep study for sleep evaluation to see if he may have RLS versus OSA, Or combination of both

## 2021-01-18 ENCOUNTER — Encounter: Payer: Self-pay | Admitting: Family Medicine

## 2021-01-19 NOTE — Telephone Encounter (Signed)
Paperwork completed last week and Tomekia took papers from box on Friday.

## 2021-02-06 ENCOUNTER — Other Ambulatory Visit: Payer: Self-pay

## 2021-02-06 ENCOUNTER — Encounter: Payer: Self-pay | Admitting: Family Medicine

## 2021-02-06 ENCOUNTER — Ambulatory Visit (INDEPENDENT_AMBULATORY_CARE_PROVIDER_SITE_OTHER): Payer: BC Managed Care – PPO | Admitting: Family Medicine

## 2021-02-06 DIAGNOSIS — G8929 Other chronic pain: Secondary | ICD-10-CM

## 2021-02-06 DIAGNOSIS — F419 Anxiety disorder, unspecified: Secondary | ICD-10-CM | POA: Insufficient documentation

## 2021-02-06 DIAGNOSIS — M545 Low back pain, unspecified: Secondary | ICD-10-CM | POA: Diagnosis not present

## 2021-02-06 DIAGNOSIS — H9313 Tinnitus, bilateral: Secondary | ICD-10-CM | POA: Insufficient documentation

## 2021-02-06 DIAGNOSIS — F919 Conduct disorder, unspecified: Secondary | ICD-10-CM | POA: Insufficient documentation

## 2021-02-06 NOTE — Assessment & Plan Note (Signed)
FMLA paperwork updated today.  He will let me know if he needs additional documentation.

## 2021-02-06 NOTE — Progress Notes (Signed)
  EMAN MORIMOTO - 40 y.o. male MRN [id number]  Date of birth: 17-Sep-1980  Subjective Chief Complaint  Patient presents with   DISABILITY    HPI Aneesh is a 40 year old male here today for follow-up of chronic low back pain.  He needs additional documentation for his FMLA paperwork.  I reviewed his paperwork with him today and we updated necessary components to this.  ROS:  A comprehensive ROS was completed and negative except as noted per HPI  No Known Allergies  Past Medical History:  Diagnosis Date   Hypertension    Low back pain     History reviewed. No pertinent surgical history.  Social History   Socioeconomic History   Marital status: Married    Spouse name: Not on file   Number of children: Not on file   Years of education: Not on file   Highest education level: Not on file  Occupational History   Not on file  Tobacco Use   Smoking status: Never   Smokeless tobacco: Never  Substance and Sexual Activity   Alcohol use: Yes   Drug use: No   Sexual activity: Yes    Birth control/protection: None  Other Topics Concern   Not on file  Social History Narrative   Not on file   Social Determinants of Health   Financial Resource Strain: Not on file  Food Insecurity: Not on file  Transportation Needs: Not on file  Physical Activity: Not on file  Stress: Not on file  Social Connections: Not on file    Family History  Problem Relation Age of Onset   Hypertension Father    Hyperlipidemia Father    Diabetes Sister     Health Maintenance  Topic Date Due   Pneumococcal Vaccine 13-13 Years old (1 - PCV) Never done   HIV Screening  Never done   TETANUS/TDAP  07/16/2016   COVID-19 Vaccine (3 - Booster for Moderna series) 08/01/2020   INFLUENZA VACCINE  02/13/2021   Hepatitis C Screening  Completed   HPV VACCINES  Aged Out      ----------------------------------------------------------------------------------------------------------------------------------------------------------------------------------------------------------------- Physical Exam BP (!) 158/91 (BP Location: Left Arm, Patient Position: Sitting, Cuff Size: Large)   Pulse (!) 109   Temp 97.8 F (36.6 C)   Ht 5\' 11"  (1.803 m)   Wt 258 lb 6.4 oz (117.2 kg)   SpO2 97%   BMI 36.04 kg/m   Physical Exam Constitutional:      Appearance: Normal appearance.  Neurological:     Mental Status: He is alert.  Psychiatric:        Mood and Affect: Mood normal.        Behavior: Behavior normal.    ------------------------------------------------------------------------------------------------------------------------------------------------------------------------------------------------------------------- Assessment and Plan  Chronic low back pain FMLA paperwork updated today.  He will let me know if he needs additional documentation.   No orders of the defined types were placed in this encounter.   No follow-ups on file.    This visit occurred during the SARS-CoV-2 public health emergency.  Safety protocols were in place, including screening questions prior to the visit, additional usage of staff PPE, and extensive cleaning of exam room while observing appropriate contact time as indicated for disinfecting solutions.

## 2021-03-28 ENCOUNTER — Ambulatory Visit (INDEPENDENT_AMBULATORY_CARE_PROVIDER_SITE_OTHER): Payer: BC Managed Care – PPO | Admitting: Neurology

## 2021-03-28 ENCOUNTER — Other Ambulatory Visit: Payer: Self-pay

## 2021-03-28 ENCOUNTER — Encounter: Payer: Self-pay | Admitting: Neurology

## 2021-03-28 VITALS — BP 133/87 | HR 80 | Ht 71.0 in | Wt 257.0 lb

## 2021-03-28 DIAGNOSIS — R0683 Snoring: Secondary | ICD-10-CM | POA: Diagnosis not present

## 2021-03-28 DIAGNOSIS — R0689 Other abnormalities of breathing: Secondary | ICD-10-CM

## 2021-03-28 DIAGNOSIS — G2581 Restless legs syndrome: Secondary | ICD-10-CM | POA: Diagnosis not present

## 2021-03-28 DIAGNOSIS — M25529 Pain in unspecified elbow: Secondary | ICD-10-CM | POA: Diagnosis not present

## 2021-03-28 DIAGNOSIS — G4719 Other hypersomnia: Secondary | ICD-10-CM

## 2021-03-28 NOTE — Progress Notes (Signed)
SLEEP MEDICINE CLINIC    Provider:  Melvyn Novas, MD  Primary Care Physician:  Everrett Coombe, DO [address] [facility] [address] [place] [address]     Referring Provider: Everrett Coombe, Do [address] [facility] [address] [place],  [place] [address]          Chief Complaint according to patient   Patient presents with:     New Patient (Initial Visit)           HISTORY OF PRESENT ILLNESS:  James Mayer is a 40 y.o.Caucasian male patient and seen here as aon PCP  referral on 03/28/2021 .  Chief concern according to patient :  "I have RLS, the irresistible urge to move , keeping from falling asleep. I can't drive and remain seated for longer than 90 minutes because of back pain. Once asleep.I can sleep but I kick and twitch and snore. I feel not rested in AM. "    I have the pleasure of seeing James Mayer today, a right-handed White or Caucasian male with a possible sleep disorder.  He  has a past medical history of Hypertension and Low back pain.     Sleep relevant medical history: Nocturia 1-3 times, no PTSD, no Tonsillectomy, cervical spine trauma in service, now DDD, hands will get numb. Shooting pains when sneezing. Fingers ring and pinky are affected.     Family medical /sleep history: No other family member on CPAP with OSA, insomnia, sleep walkers.    Social history:  Patient is working as Field seismologist , retired from [employer] and lives in a household with spouse, 4 children, dog and cat and snake.  The patient currently works/ used to work in shifts( night/ rotating,) Tobacco use: none .  ETOH use /month, Caffeine intake in form of Coffee( decaff. One a week ) Soda( /) Tea ( eating out ) or energy drinks. Regular exercise: none .       Sleep habits are as follows: The patient's dinner time is between 5-6 PM.  The patient goes to bed at 9.30-11 PM and continues to sleep for  3-6 hours, wakes for one bathroom breaks. The preferred sleep  position is prone , with the support of 1 pillow plus body pillow. Dreams are reportedly frequent, when taking tramadol. 6.30 AM is the usual rise time. The patient wakes up with an alarm.  He reports not feeling refreshed or restored in AM, with symptoms such as dry mouth, morning headaches, and residual fatigue.  Naps are taken infrequently, lasting from 15 to 60 minutes and are not more  refreshing than nocturnal sleep. Mostly when feeling sick.     Review of Systems: Out of a complete 14 system review, the patient complains of only the following symptoms, and all other reviewed systems are negative.:  Fatigue, sleepiness , snoring, fragmented sleep,RLS   How likely are you to doze in the following situations: 0 = not likely, 1 = slight chance, 2 = moderate chance, 3 = high chance   Sitting and Reading? Watching Television? Sitting inactive in a public place (theater or meeting)? As a passenger in a car for an hour without a break? Lying down in the afternoon when circumstances permit? Sitting and talking to someone? Sitting quietly after lunch without alcohol? In a car, while stopped for a few minutes in traffic?   Total = 14/ 24 points   FSS endorsed at 34/ 63 points.  Social History   Socioeconomic History   Marital status: Married    Spouse name: James Mayer   Number of children: 4   Years of education: Not on file   Highest education level: Some college, no degree  Occupational History   Not on file  Tobacco Use   Smoking status: Never   Smokeless tobacco: Never  Substance and Sexual Activity   Alcohol use: Yes    Alcohol/week: 1.0 standard drink    Types: 1 Shots of liquor per week    Comment: rarely   Drug use: No   Sexual activity: Yes    Birth control/protection: None  Other Topics Concern   Not on file  Social History Narrative   Live w wife and 2 daughter full time and 2 other children 60% of the time   Right handed   Caffeine: decaf tea and coffee   Social  Determinants of Corporate investment banker Strain: Not on file  Food Insecurity: Not on file  Transportation Needs: Not on file  Physical Activity: Not on file  Stress: Not on file  Social Connections: Not on file    Family History  Problem Relation Age of Onset   Hypertension Father    Hyperlipidemia Father    Diabetes Sister    Diabetes Maternal Grandmother    Cancer Maternal Grandmother    Cancer Maternal Grandfather    Diabetes Paternal Grandmother    Neurologic Disorder Paternal Grandfather     Past Medical History:  Diagnosis Date   Hypertension    Low back pain     History reviewed. No pertinent surgical history.   Current Outpatient Medications on File Prior to Visit  Medication Sig Dispense Refill   albuterol (VENTOLIN HFA) 108 (90 Base) MCG/ACT inhaler Inhale 2 puffs into the lungs every 6 (six) hours as needed for wheezing or shortness of breath. 8 g 0   busPIRone (BUSPAR) 5 MG tablet Take 5 mg by mouth 3 (three) times daily.     escitalopram (LEXAPRO) 10 MG tablet Take 10 mg by mouth once.     lisinopril-hydrochlorothiazide (ZESTORETIC) 20-25 MG tablet Take 1 tablet by mouth daily.     meloxicam (MOBIC) 15 MG tablet Take 1 tablet (15 mg total) by mouth daily. 30 tablet 6   tiZANidine (ZANAFLEX) 4 MG tablet TAKE ONE TABLET BY MOUTH EVERY 6 HOURS AS NEEDED FOR MUSCLE SPASMS 30 tablet 6   traMADol (ULTRAM) 50 MG tablet TAKE ONE TABLET BY MOUTH EVERY 8 HOURS AS NEEDED FOR MODERATE PAIN 30 tablet 2   VALERIAN ROOT PO Take 1 capsule by mouth daily.     No current facility-administered medications on file prior to visit.    No Known Allergies  Physical exam:  Today's Vitals   03/28/21 0852  BP: 133/87  Pulse: 80  Weight: 257 lb (116.6 kg)  Height: 5\' 11"  (1.803 m)   Body mass index is 35.84 kg/m.   Wt Readings from Last 3 Encounters:  03/28/21 257 lb (116.6 kg)  02/06/21 258 lb 6.4 oz (117.2 kg)  01/13/21 257 lb (116.6 kg)     Ht Readings from  Last 3 Encounters:  03/28/21 5\' 11"  (1.803 m)  02/06/21 5\' 11"  (1.803 m)  01/13/21 5' 11.75" (1.822 m)      General: The patient is awake, alert and appears not in acute distress. The patient is well groomed. Head: Normocephalic, atraumatic. Neck is supple. Mallampati 3,  neck circumference:19.5  inches .  Nasal  airflow  patent.  Retrognathia is  seen.  Dental status: biological  Cardiovascular:  Regular rate and cardiac rhythm by pulse,  without distended neck veins. Respiratory: Lungs are clear to auscultation.  Skin:  Without evidence of ankle edema, or rash. Trunk: The patient's posture is erect.   Neurologic exam : The patient is awake and alert, oriented to place and time.   Memory subjective described as intact.  Attention span & concentration ability appears normal.  Speech is fluent,  without dysarthria, dysphonia or aphasia.  Mood and affect are appropriate.   Cranial nerves: no loss of smell or taste reported  Pupils are equal and briskly reactive to light. Funduscopic exam deferred.   Extraocular movements in vertical and horizontal planes were intact and without nystagmus. No Diplopia. Visual fields by finger perimetry are intact. Hearing was intact to soft voice and finger rubbing.    Facial sensation intact to fine touch.  Facial motor strength is symmetric and tongue and uvula move midline.  Neck ROM : rotation, tilt and flexion extension were normal for age and shoulder shrug was symmetrical.    Motor exam:  Symmetric bulk, tone and ROM.   Normal tone without cog wheeling, symmetrically reduced  grip strength . Very weak grip g for his size and mass!    Sensory:  Fine touch, pinprick and vibration were tested  and  normal.  Proprioception tested in the upper extremities was normal.   Coordination: Rapid alternating movements in the fingers/hands were of normal speed.  The Finger-to-nose maneuver was intact without evidence of ataxia, dysmetria or tremor.    Gait and station: Patient could rise unassisted from a seated position, walked without assistive device.  Stance is of normal width/ base and the patient turned with 3 steps.  Toe and heel walk were deferred.  Deep tendon reflexes: in the  upper and lower extremities are symmetric and intact.  Babinski response was deferred .      After spending a total time of 45 minutes face to face and additional time for physical and neurologic examination, review of laboratory studies,  personal review of imaging studies, reports and results of other testing and review of referral information / records as far as provided in visit, I have established the following assessments:  1) snoring 2) RLS- 3) joint and back pain.   My Plan is to proceed with:  1)screening for OSA by snoring and witnessed apneas. T I would like to obtain an attended sleep study.  2) PLM/ relate to RLS ? This would require an attended sleep study.  3) multifocal arthritic pain.   I would like to thank Everrett Coombe, DO ,[address] [place],  [place] [address] for allowing me to meet with and to take care of this pleasant patient.   In short, James Mayer is presenting with RLS, PLMs and snoring, EDS- , a symptom that can be attributed to OSA and DDD.   I plan to follow up either personally or through our NP within 2-4  month.   CC: I will share my notes with PCP.  Electronically signed by: Melvyn Novas, MD 03/28/2021 9:03 AM  [facility] Board certified by The [employer] of Sleep Medicine and Diplomate of the [employer] of Sleep Medicine. Board certified In Neurology through the ABPN, Fellow of the [employer] of Neurology. Medical Director of [facility].

## 2021-06-13 DIAGNOSIS — M25561 Pain in right knee: Secondary | ICD-10-CM | POA: Diagnosis not present

## 2021-06-13 DIAGNOSIS — M5416 Radiculopathy, lumbar region: Secondary | ICD-10-CM | POA: Diagnosis not present

## 2021-06-13 DIAGNOSIS — I10 Essential (primary) hypertension: Secondary | ICD-10-CM | POA: Diagnosis not present

## 2021-06-13 DIAGNOSIS — F419 Anxiety disorder, unspecified: Secondary | ICD-10-CM | POA: Diagnosis not present

## 2021-06-19 ENCOUNTER — Telehealth: Payer: Self-pay | Admitting: Neurology

## 2021-06-19 NOTE — Telephone Encounter (Signed)
LVM for pt to call me back to schedule sleep study  

## 2021-06-27 ENCOUNTER — Telehealth: Payer: Self-pay | Admitting: Neurology

## 2021-06-27 NOTE — Telephone Encounter (Signed)
Tried to conatact pt to r/s his sleep study but have been unable to contact. If pt calls back I will schedule.

## 2021-07-06 ENCOUNTER — Other Ambulatory Visit: Payer: Self-pay | Admitting: Family Medicine

## 2021-07-06 NOTE — Telephone Encounter (Signed)
Patient also left vm asking for refills Last written 01/13/2021 #30 with 2 refills Last visit 02/06/2021

## 2021-08-06 DIAGNOSIS — I10 Essential (primary) hypertension: Secondary | ICD-10-CM | POA: Diagnosis not present

## 2021-08-06 DIAGNOSIS — F419 Anxiety disorder, unspecified: Secondary | ICD-10-CM | POA: Diagnosis not present

## 2021-08-06 DIAGNOSIS — R059 Cough, unspecified: Secondary | ICD-10-CM | POA: Diagnosis not present

## 2021-08-06 DIAGNOSIS — J209 Acute bronchitis, unspecified: Secondary | ICD-10-CM | POA: Diagnosis not present

## 2021-08-06 DIAGNOSIS — Z20822 Contact with and (suspected) exposure to covid-19: Secondary | ICD-10-CM | POA: Diagnosis not present

## 2021-09-10 DIAGNOSIS — H6693 Otitis media, unspecified, bilateral: Secondary | ICD-10-CM | POA: Diagnosis not present

## 2021-09-10 DIAGNOSIS — I10 Essential (primary) hypertension: Secondary | ICD-10-CM | POA: Diagnosis not present

## 2021-09-10 DIAGNOSIS — H1089 Other conjunctivitis: Secondary | ICD-10-CM | POA: Diagnosis not present

## 2021-09-10 DIAGNOSIS — J0110 Acute frontal sinusitis, unspecified: Secondary | ICD-10-CM | POA: Diagnosis not present

## 2021-09-10 DIAGNOSIS — E669 Obesity, unspecified: Secondary | ICD-10-CM | POA: Diagnosis not present

## 2021-09-10 DIAGNOSIS — Z6834 Body mass index (BMI) 34.0-34.9, adult: Secondary | ICD-10-CM | POA: Diagnosis not present

## 2021-09-10 DIAGNOSIS — F419 Anxiety disorder, unspecified: Secondary | ICD-10-CM | POA: Diagnosis not present

## 2021-09-13 ENCOUNTER — Other Ambulatory Visit: Payer: Self-pay | Admitting: Family Medicine

## 2021-09-27 ENCOUNTER — Ambulatory Visit (INDEPENDENT_AMBULATORY_CARE_PROVIDER_SITE_OTHER): Payer: BC Managed Care – PPO | Admitting: Family Medicine

## 2021-09-27 ENCOUNTER — Encounter: Payer: Self-pay | Admitting: Family Medicine

## 2021-09-27 ENCOUNTER — Other Ambulatory Visit: Payer: Self-pay

## 2021-09-27 DIAGNOSIS — H6503 Acute serous otitis media, bilateral: Secondary | ICD-10-CM | POA: Diagnosis not present

## 2021-09-27 DIAGNOSIS — H6590 Unspecified nonsuppurative otitis media, unspecified ear: Secondary | ICD-10-CM | POA: Insufficient documentation

## 2021-09-27 NOTE — Assessment & Plan Note (Signed)
No signs of bacterial infection at this time.  Continues to have fluid bilaterally.  Continue Flonase daily.  Recommend addition of cetirizine with Sudafed.  He will let me know if symptoms are not improving over the next 10 to 14 days.  Will refer to ENT if not resolving. ?

## 2021-09-27 NOTE — Progress Notes (Signed)
?James Mayer - 41 y.o. male MRN [id number]  Date of birth: [birth year] ? ?Subjective ?Chief Complaint  ?Patient presents with  ? Tinnitus  ? ? ?HPI ?James Mayer is a 41 year old male here today with complaint of tinnitus.  Recently had bilateral otitis media which was treated with Augmentin and prednisone.  Reports improvement of pain but continues to have tinnitus.  Reports that the tenderness is severe enough that he has difficulty hearing in the right ear.  He denies any headaches or dizziness. ? ?ROS:  A comprehensive ROS was completed and negative except as noted per HPI ? ? ?No Known Allergies ? ?Past Medical History:  ?Diagnosis Date  ? Hypertension   ? Low back pain   ? ? ?History reviewed. No pertinent surgical history. ? ?Social History  ? ?Socioeconomic History  ? Marital status: Married  ?  Spouse name: Abbi  ? Number of children: 4  ? Years of education: Not on file  ? Highest education level: Some college, no degree  ?Occupational History  ? Not on file  ?Tobacco Use  ? Smoking status: Never  ? Smokeless tobacco: Never  ?Substance and Sexual Activity  ? Alcohol use: Yes  ?  Alcohol/week: 1.0 standard drink  ?  Types: 1 Shots of liquor per week  ?  Comment: rarely  ? Drug use: No  ? Sexual activity: Yes  ?  Birth control/protection: None  ?Other Topics Concern  ? Not on file  ?Social History Narrative  ? Live w wife and 2 daughter full time and 2 other children 60% of the time  ? Right handed  ? Caffeine: decaf tea and coffee  ? ?Social Determinants of Health  ? ?Financial Resource Strain: Not on file  ?Food Insecurity: Not on file  ?Transportation Needs: Not on file  ?Physical Activity: Not on file  ?Stress: Not on file  ?Social Connections: Not on file  ? ? ?Family History  ?Problem Relation Age of Onset  ? Hypertension Father   ? Hyperlipidemia Father   ? Diabetes Sister   ? Diabetes Maternal Grandmother   ? Cancer Maternal Grandmother   ? Cancer Maternal Grandfather   ? Diabetes Paternal Grandmother    ? Neurologic Disorder Paternal Grandfather   ? ? ?Health Maintenance  ?Topic Date Due  ? HIV Screening  Never done  ? TETANUS/TDAP  07/16/2016  ? COVID-19 Vaccine (3 - Booster for Moderna series) 04/26/2020  ? INFLUENZA VACCINE  02/13/2021  ? Hepatitis C Screening  Completed  ? HPV VACCINES  Aged Out  ? ? ? ?----------------------------------------------------------------------------------------------------------------------------------------------------------------------------------------------------------------- ?Physical Exam ?BP 108/72 (BP Location: Right Arm, Patient Position: Sitting, Cuff Size: Large)   Pulse 72   Ht 5\' 11"  (1.803 m)   Wt 247 lb (112 kg)   SpO2 96%   BMI 34.45 kg/m?  ? ?Physical Exam ?Constitutional:   ?   Appearance: Normal appearance.  ?HENT:  ?   Ears:  ?   Comments: Serous fluid noted posterior to TM bilaterally. ?Eyes:  ?   General: No scleral icterus. ?Cardiovascular:  ?   Rate and Rhythm: Normal rate and regular rhythm.  ?Pulmonary:  ?   Effort: Pulmonary effort is normal.  ?   Breath sounds: Normal breath sounds.  ?Musculoskeletal:  ?   Cervical back: Neck supple.  ?Neurological:  ?   Mental Status: He is alert.  ?Psychiatric:     ?   Mood and Affect: Mood normal.     ?  Behavior: Behavior normal.  ? ? ?------------------------------------------------------------------------------------------------------------------------------------------------------------------------------------------------------------------- ?Assessment and Plan ? ?Serous otitis media ?No signs of bacterial infection at this time.  Continues to have fluid bilaterally.  Continue Flonase daily.  Recommend addition of cetirizine with Sudafed.  He will let me know if symptoms are not improving over the next 10 to 14 days.  Will refer to ENT if not resolving. ? ? ?No orders of the defined types were placed in this encounter. ? ? ?No follow-ups on file. ? ? ? ?This visit occurred during the SARS-CoV-2 public  health emergency.  Safety protocols were in place, including screening questions prior to the visit, additional usage of staff PPE, and extensive cleaning of exam room while observing appropriate contact time as indicated for disinfecting solutions.  ? ?

## 2021-09-27 NOTE — Patient Instructions (Signed)
Add Cetirizine-D/zyrtec-D.  Continue flonase daily.  ?Let me know if symptoms are not improving after 10-14 days.  ? ? ?

## 2021-10-16 ENCOUNTER — Other Ambulatory Visit: Payer: Self-pay | Admitting: Family Medicine

## 2021-11-27 ENCOUNTER — Telehealth: Payer: BC Managed Care – PPO | Admitting: Physician Assistant

## 2021-11-27 ENCOUNTER — Encounter: Payer: Self-pay | Admitting: Physician Assistant

## 2021-11-27 DIAGNOSIS — M2550 Pain in unspecified joint: Secondary | ICD-10-CM

## 2021-11-27 MED ORDER — PREDNISONE 10 MG PO TABS: ORAL_TABLET | ORAL | 0 refills | Status: AC

## 2021-11-27 NOTE — Progress Notes (Signed)
?Virtual Visit Consent  ? ?James Mayer, you are scheduled for a virtual visit with a [facility] provider today. Just as with appointments in the office, your consent must be obtained to participate. Your consent will be active for this visit and any virtual visit you may have with one of our providers in the next 365 days. If you have a MyChart account, a copy of this consent can be sent to you electronically. ? ?As this is a virtual visit, video technology does not allow for your provider to perform a traditional examination. This may limit your provider's ability to fully assess your condition. If your provider identifies any concerns that need to be evaluated in person or the need to arrange testing (such as labs, EKG, etc.), we will make arrangements to do so. Although advances in technology are sophisticated, we cannot ensure that it will always work on either your end or our end. If the connection with a video visit is poor, the visit may have to be switched to a telephone visit. With either a video or telephone visit, we are not always able to ensure that we have a secure connection. ? ?By engaging in this virtual visit, you consent to the provision of healthcare and authorize for your insurance to be billed (if applicable) for the services provided during this visit. Depending on your insurance coverage, you may receive a charge related to this service. ? ?I need to obtain your verbal consent now. Are you willing to proceed with your visit today? James Mayer has provided verbal consent on 11/27/2021 for a virtual visit (video or telephone). Leeanne Rio, PA-C ? ?Date: 11/27/2021 9:09 AM ? ?Virtual Visit via Video Note  ? ?I, Leeanne Rio, PA-C, attempted to connect with James Mayer; MRN [id number] on 11/27/21 via [facility] to complete a video urgent care visit. The patient was unable to successfully connect to the video platform. As such, the patient was contacted by this  provider via phone to complete the encounter.  ? ?Location: ?Patient: Virtual Visit Location Patient: Home ?Provider: Virtual Visit Location Provider: Home Office ?  ?I discussed the limitations of evaluation and management by telemedicine and the availability of in person appointments. The patient expressed understanding and agreed to proceed.   ? ?History of Present Illness: ?James Mayer is a 41 y.o. who identifies as a male who was assigned male at birth, and is being seen today for arthralgias x 7 days. Notes widespread joint pains worse in hands, shoulder and hips. Some mild swelling of joints of hands but no other joint swelling noted. Denies redness to tenderness of joints themselves. Sometimes knees and elbows involved. Denies fever, chills, rash. Denies any new sexual partner or concern for STI. Denies any known tick bite. Denies recent change in medications. Has chronic low back pain and this has been stable overall. History of gout in the past of his ankle only. Last urine acid level checked routinely was normal range. Denies family history of known RA, PA, SLE or other autoimmune disease. Denies rash.  ? ? ?HPI: HPI  ?Problems:  ?Patient Active Problem List  ? Diagnosis Date Noted  ? Serous otitis media 09/27/2021  ? Tinnitus, bilateral 02/06/2021  ? Anxiety disorder, unspecified 02/06/2021  ? Conduct disorder, unspecified 02/06/2021  ? Restless legs 01/15/2021  ? Neck pain 08/22/2020  ? Idiopathic peripheral neuropathy 08/22/2020  ? Cervical spondylosis 08/22/2020  ? Bronchitis 08/22/2020  ? Acute low back pain 01/27/2020  ?  Acute left ankle pain 08/28/2019  ? Polydipsia 07/31/2019  ? Arthralgia 04/14/2019  ? Essential hypertension 08/26/2018  ? Chronic low back pain 08/26/2018  ?  ?Allergies: No Known Allergies ?Medications:  ?Current Outpatient Medications:  ?  predniSONE (DELTASONE) 10 MG tablet, Take 4 tablets (40 mg total) by mouth daily with breakfast for 3 days, THEN 3 tablets (30 mg total)  daily with breakfast for 3 days, THEN 2 tablets (20 mg total) daily with breakfast for 3 days, THEN 1 tablet (10 mg total) daily with breakfast for 3 days., Disp: 30 tablet, Rfl: 0 ?  albuterol (VENTOLIN HFA) 108 (90 Base) MCG/ACT inhaler, Inhale 2 puffs into the lungs every 6 (six) hours as needed for wheezing or shortness of breath., Disp: 8 g, Rfl: 0 ?  busPIRone (BUSPAR) 5 MG tablet, Take 5 mg by mouth 3 (three) times daily., Disp: , Rfl:  ?  escitalopram (LEXAPRO) 10 MG tablet, Take 10 mg by mouth once., Disp: , Rfl:  ?  lisinopril-hydrochlorothiazide (ZESTORETIC) 20-25 MG tablet, Take 1 tablet by mouth daily., Disp: , Rfl:  ?  meloxicam (MOBIC) 15 MG tablet, TAKE ONE TABLET BY MOUTH DAILY, Disp: 30 tablet, Rfl: 3 ?  tiZANidine (ZANAFLEX) 4 MG tablet, TAKE ONE TABLET BY MOUTH EVERY 6 HOURS AS NEEDED FOR MUSCLE SPASMS, Disp: 30 tablet, Rfl: 6 ?  traMADol (ULTRAM) 50 MG tablet, TAKE ONE TABLET BY MOUTH EVERY 8 HOURS AS NEEDED FOR MODERATE PAIN, Disp: 30 tablet, Rfl: 0 ?  VALERIAN ROOT PO, Take 1 capsule by mouth daily., Disp: , Rfl:  ? ?Observations/Objective: ? ?No labored breathing. ?Speech is clear and coherent with logical content.  ?Patient is alert and oriented at baseline.  ? ? ?Assessment and Plan: ?1. Arthralgia, unspecified joint ?- predniSONE (DELTASONE) 10 MG tablet; Take 4 tablets (40 mg total) by mouth daily with breakfast for 3 days, THEN 3 tablets (30 mg total) daily with breakfast for 3 days, THEN 2 tablets (20 mg total) daily with breakfast for 3 days, THEN 1 tablet (10 mg total) daily with breakfast for 3 days.  Dispense: 30 tablet; Refill: 0 ? ?Widespread but proximal joints of shoulder, hips, and digits mainly involved. Afebrile. No known risk factors for autoimmune illness. Will start Steroid taper to help calm down symptoms so he can better function until he can get in with PCP to get a full lab evaluation to rule out more chronic/recurrent causes of symptoms.  ? ?Follow Up Instructions: ?I  discussed the assessment and treatment plan with the patient. The patient was provided an opportunity to ask questions and all were answered. The patient agreed with the plan and demonstrated an understanding of the instructions.  A copy of instructions were sent to the patient via MyChart unless otherwise noted below.  ? ?The patient was advised to call back or seek an in-person evaluation if the symptoms worsen or if the condition fails to improve as anticipated. ? ?Time:  ?I spent 10 minutes with the patient via telehealth technology discussing the above problems/concerns.   ? ?Leeanne Rio, PA-C ?

## 2021-11-27 NOTE — Patient Instructions (Signed)
?  Leitha Bleak, thank you for joining Piedad Climes, PA-C for today's virtual visit.  While this provider is not your primary care provider (PCP), if your PCP is located in our provider database this encounter information will be shared with them immediately following your visit. ? ?Consent: ?(Patient) James Mayer provided verbal consent for this virtual visit at the beginning of the encounter. ? ?Current Medications: ? ?Current Outpatient Medications:  ?  albuterol (VENTOLIN HFA) 108 (90 Base) MCG/ACT inhaler, Inhale 2 puffs into the lungs every 6 (six) hours as needed for wheezing or shortness of breath., Disp: 8 g, Rfl: 0 ?  busPIRone (BUSPAR) 5 MG tablet, Take 5 mg by mouth 3 (three) times daily., Disp: , Rfl:  ?  escitalopram (LEXAPRO) 10 MG tablet, Take 10 mg by mouth once., Disp: , Rfl:  ?  lisinopril-hydrochlorothiazide (ZESTORETIC) 20-25 MG tablet, Take 1 tablet by mouth daily., Disp: , Rfl:  ?  meloxicam (MOBIC) 15 MG tablet, TAKE ONE TABLET BY MOUTH DAILY, Disp: 30 tablet, Rfl: 3 ?  tiZANidine (ZANAFLEX) 4 MG tablet, TAKE ONE TABLET BY MOUTH EVERY 6 HOURS AS NEEDED FOR MUSCLE SPASMS, Disp: 30 tablet, Rfl: 6 ?  traMADol (ULTRAM) 50 MG tablet, TAKE ONE TABLET BY MOUTH EVERY 8 HOURS AS NEEDED FOR MODERATE PAIN, Disp: 30 tablet, Rfl: 0 ?  VALERIAN ROOT PO, Take 1 capsule by mouth daily., Disp: , Rfl:   ? ?Medications ordered in this encounter:  ?No orders of the defined types were placed in this encounter. ?  ? ?*If you need refills on other medications prior to your next appointment, please contact your pharmacy* ? ?Follow-Up: ?Call back or seek an in-person evaluation if the symptoms worsen or if the condition fails to improve as anticipated. ? ?Other Instructions ? ? ?If you have been instructed to have an in-person evaluation today at a local Urgent Care facility, please use the link below. It will take you to a list of all of our available [facility], including address,  phone number and hours of operation. Please do not delay care.  ?[facility] ? ?If you or a family member do not have a primary care provider, use the link below to schedule a visit and establish care. When you choose a [facility] primary care physician or advanced practice provider, you gain a long-term partner in health. ?Find a Primary Care Provider ? ?Learn more about [facility] in-office and virtual care options: ?[facility] - Get Care Now  ?

## 2021-12-20 ENCOUNTER — Other Ambulatory Visit: Payer: Self-pay | Admitting: Family Medicine

## 2021-12-27 ENCOUNTER — Encounter: Payer: Self-pay | Admitting: Family Medicine

## 2021-12-27 ENCOUNTER — Telehealth (INDEPENDENT_AMBULATORY_CARE_PROVIDER_SITE_OTHER): Payer: BC Managed Care – PPO | Admitting: Family Medicine

## 2021-12-27 DIAGNOSIS — M2550 Pain in unspecified joint: Secondary | ICD-10-CM | POA: Diagnosis not present

## 2021-12-27 DIAGNOSIS — G8929 Other chronic pain: Secondary | ICD-10-CM | POA: Diagnosis not present

## 2021-12-27 DIAGNOSIS — M545 Low back pain, unspecified: Secondary | ICD-10-CM

## 2021-12-27 MED ORDER — TRAMADOL HCL 50 MG PO TABS: 50.0000 mg | ORAL_TABLET | Freq: Three times a day (TID) | ORAL | 3 refills | Status: DC | PRN

## 2021-12-27 NOTE — Progress Notes (Signed)
James Mayer - 41 y.o. male MRN [id number]  Date of birth: 22-Mar-1981   This visit type was conducted due to national recommendations for restrictions regarding the COVID-19 Pandemic (e.g. social distancing).  This format is felt to be most appropriate for this patient at this time.  All issues noted in this document were discussed and addressed.  No physical exam was performed (except for noted visual exam findings with Video Visits).  I discussed the limitations of evaluation and management by telemedicine and the availability of in person appointments. The patient expressed understanding and agreed to proceed.  I connected withNAME@ on 12/27/21 at  8:10 AM EDT by a video enabled telemedicine application and verified that I am speaking with the correct person using two identifiers.  Present at visit: James Coombe, DO Leitha Bleak   Patient Location: Home [address] [place] [address]   Provider location:   [facility]  Chief Complaint  Patient presents with   Follow-up    HPI  James Mayer is a 41 y.o. male who presents via audio/video conferencing for a telehealth visit today.  He is following up today for arthralgias.  Seen via virtual visit about 1 month ago for arthralgias.  No obvious cause of his arthralgias based on history.  No known tick bites or preceding illness.  He was prescribed a course of prednisone and had resolution of symptoms with this.  He has not had any reoccurrence since being off of prednisone.  There were no fevers and he really didn't have joint swelling associated with this.      ROS:  A comprehensive ROS was completed and negative except as noted per HPI  Past Medical History:  Diagnosis Date   Hypertension    Low back pain     No past surgical history on file.  Family History  Problem Relation Age of Onset   Hypertension Father    Hyperlipidemia Father    Diabetes Sister    Diabetes Maternal Grandmother    Cancer  Maternal Grandmother    Cancer Maternal Grandfather    Diabetes Paternal Grandmother    Neurologic Disorder Paternal Grandfather     Social History   Socioeconomic History   Marital status: Married    Spouse name: Abbi   Number of children: 4   Years of education: Not on file   Highest education level: Some college, no degree  Occupational History   Not on file  Tobacco Use   Smoking status: Never   Smokeless tobacco: Never  Substance and Sexual Activity   Alcohol use: Yes    Alcohol/week: 1.0 standard drink of alcohol    Types: 1 Shots of liquor per week    Comment: rarely   Drug use: No   Sexual activity: Yes    Birth control/protection: None  Other Topics Concern   Not on file  Social History Narrative   Live w wife and 2 daughter full time and 2 other children 60% of the time   Right handed   Caffeine: decaf tea and coffee   Social Determinants of Corporate investment banker Strain: Not on file  Food Insecurity: Not on file  Transportation Needs: Not on file  Physical Activity: Not on file  Stress: Not on file  Social Connections: Not on file  Intimate Partner Violence: Not on file     Current Outpatient Medications:    albuterol (VENTOLIN HFA) 108 (90 Base) MCG/ACT inhaler, Inhale 2 puffs into  the lungs every 6 (six) hours as needed for wheezing or shortness of breath., Disp: 8 g, Rfl: 0   busPIRone (BUSPAR) 5 MG tablet, Take 5 mg by mouth 3 (three) times daily., Disp: , Rfl:    escitalopram (LEXAPRO) 10 MG tablet, Take 10 mg by mouth once., Disp: , Rfl:    lisinopril-hydrochlorothiazide (ZESTORETIC) 20-25 MG tablet, Take 1 tablet by mouth daily., Disp: , Rfl:    meloxicam (MOBIC) 15 MG tablet, TAKE ONE TABLET BY MOUTH DAILY, Disp: 30 tablet, Rfl: 3   tiZANidine (ZANAFLEX) 4 MG tablet, TAKE ONE TABLET BY MOUTH EVERY 6 HOURS AS NEEDED FOR MUSCLE SPASMS, Disp: 30 tablet, Rfl: 6   traMADol (ULTRAM) 50 MG tablet, TAKE ONE TABLET BY MOUTH EVERY 8 HOURS AS NEEDED  FOR MODERATE PAIN, Disp: 30 tablet, Rfl: 0   VALERIAN ROOT PO, Take 1 capsule by mouth daily., Disp: , Rfl:   EXAM:  VITALS per patient if applicable: Ht 5\' 11"  (1.803 m)   Wt 242 lb (109.8 kg)   BMI 33.75 kg/m   GENERAL: alert, oriented, appears well and in no acute distress  HEENT: atraumatic, conjunttiva clear, no obvious abnormalities on inspection of external nose and ears  NECK: normal movements of the head and neck  LUNGS: on inspection no signs of respiratory distress, breathing rate appears normal, no obvious gross SOB, gasping or wheezing  CV: no obvious cyanosis  MS: moves all visible extremities without noticeable abnormality  PSYCH/NEURO: pleasant and cooperative, no obvious depression or anxiety, speech and thought processing grossly intact  ASSESSMENT AND PLAN:  Discussed the following assessment and plan:  Arthralgia Improved with prednisone. Checking inflammatory markers and uric acid levels.   Chronic low back pain Renewal of tramadol sent in.       I discussed the assessment and treatment plan with the patient. The patient was provided an opportunity to ask questions and all were answered. The patient agreed with the plan and demonstrated an understanding of the instructions.   The patient was advised to call back or seek an in-person evaluation if the symptoms worsen or if the condition fails to improve as anticipated.    , DO

## 2021-12-27 NOTE — Assessment & Plan Note (Signed)
Improved with prednisone. Checking inflammatory markers and uric acid levels.

## 2021-12-27 NOTE — Assessment & Plan Note (Signed)
Renewal of tramadol sent in.

## 2022-02-01 ENCOUNTER — Telehealth: Payer: Self-pay | Admitting: Family Medicine

## 2022-02-01 NOTE — Telephone Encounter (Signed)
Patients wife dropped of FMLA paperwork to be completed by PCP - Informed of possible fee and 3-5 day turn around - Paperwork in providers box - lmr.

## 2022-02-02 NOTE — Telephone Encounter (Signed)
Form completed and placed in James Mayer's box

## 2022-02-28 ENCOUNTER — Encounter: Payer: Self-pay | Admitting: Family Medicine

## 2022-02-28 MED ORDER — MELOXICAM 15 MG PO TABS: 15.0000 mg | ORAL_TABLET | Freq: Every day | ORAL | 3 refills | Status: DC

## 2022-02-28 NOTE — Telephone Encounter (Signed)
Never seen this pt

## 2022-02-28 NOTE — Telephone Encounter (Signed)
Rx renewed

## 2022-03-22 ENCOUNTER — Telehealth: Payer: Self-pay

## 2022-03-22 NOTE — Telephone Encounter (Signed)
Patient dropped off paperwork that was filled out by Dr. Ashley Royalty. He didn't fill out questions 1 and 5. She would like a call once the paperwork is finished. Paperwork is placed in the basket. Tvt

## 2022-04-11 ENCOUNTER — Ambulatory Visit (INDEPENDENT_AMBULATORY_CARE_PROVIDER_SITE_OTHER): Payer: BC Managed Care – PPO | Admitting: Family Medicine

## 2022-04-11 ENCOUNTER — Encounter: Payer: Self-pay | Admitting: Family Medicine

## 2022-04-11 ENCOUNTER — Ambulatory Visit (INDEPENDENT_AMBULATORY_CARE_PROVIDER_SITE_OTHER): Payer: BC Managed Care – PPO

## 2022-04-11 VITALS — BP 130/94 | HR 83 | Ht 71.0 in | Wt 234.0 lb

## 2022-04-11 DIAGNOSIS — M2550 Pain in unspecified joint: Secondary | ICD-10-CM

## 2022-04-11 DIAGNOSIS — M79642 Pain in left hand: Secondary | ICD-10-CM

## 2022-04-11 DIAGNOSIS — M7989 Other specified soft tissue disorders: Secondary | ICD-10-CM | POA: Diagnosis not present

## 2022-04-11 DIAGNOSIS — M79641 Pain in right hand: Secondary | ICD-10-CM | POA: Diagnosis not present

## 2022-04-11 MED ORDER — DICLOFENAC SODIUM 75 MG PO TBEC: 75.0000 mg | DELAYED_RELEASE_TABLET | Freq: Two times a day (BID) | ORAL | 1 refills | Status: DC

## 2022-04-11 NOTE — Assessment & Plan Note (Addendum)
Continues to have arthralgias.  He did not have the labs completed ordered at previous visit.  We will go ahead and do these today.  X-rays of bilateral hands ordered.  Could potentially be occupational as he does carry a small iPad around and scrolling through this frequently at his job.  Changing meloxicam to diclofenac to see if this is more effective for him.

## 2022-04-11 NOTE — Patient Instructions (Signed)
Try diclofenac. Let me know MRI results from the [facility].  We'll be in touch with lab results and xray results.

## 2022-04-11 NOTE — Progress Notes (Signed)
James Mayer - 41 y.o. male MRN [id number]  Date of birth: [birth year]  Subjective Chief Complaint  Patient presents with   Back Pain   Hand Pain    HPI James Mayer is a 41 year old male here today with complaint of increased joint pain.  Has history of chronic back and joint pain with worsening over the past couple months.  Current pain is mostly in bilateral hands.  He has had some stiffness and has noted some swelling in his hands.  Additionally he has noted some sensation changes to his hands at times.  He was seen at the [facility] and they have ordered an MRI of his cervical spine.  In regards to management of his current pain he is prescribed meloxicam and tramadol.  He does not feel that meloxicam is as effective for him.  He did try diclofenac that someone else had and felt like this may have worked better.  ROS:  A comprehensive ROS was completed and negative except as noted per HPI With total attention No Known Allergies  Past Medical History:  Diagnosis Date   Hypertension    Low back pain     History reviewed. No pertinent surgical history.  Social History   Socioeconomic History   Marital status: Married    Spouse name: Abbi   Number of children: 4   Years of education: Not on file   Highest education level: Some college, no degree  Occupational History   Not on file  Tobacco Use   Smoking status: Never   Smokeless tobacco: Never  Substance and Sexual Activity   Alcohol use: Yes    Alcohol/week: 1.0 standard drink of alcohol    Types: 1 Shots of liquor per week    Comment: rarely   Drug use: No   Sexual activity: Yes    Birth control/protection: None  Other Topics Concern   Not on file  Social History Narrative   Live w wife and 2 daughter full time and 2 other children 60% of the time   Right handed   Caffeine: decaf tea and coffee   Social Determinants of Health   Financial Resource Strain: Not on file  Food Insecurity: Not on file  Transportation Needs:  Not on file  Physical Activity: Not on file  Stress: Not on file  Social Connections: Not on file    Family History  Problem Relation Age of Onset   Hypertension Father    Hyperlipidemia Father    Diabetes Sister    Diabetes Maternal Grandmother    Cancer Maternal Grandmother    Cancer Maternal Grandfather    Diabetes Paternal Grandmother    Neurologic Disorder Paternal Grandfather     Health Maintenance  Topic Date Due   COVID-19 Vaccine (3 - Moderna series) 04/27/2022 (Originally 04/26/2020)   INFLUENZA VACCINE  10/14/2022 (Originally 02/13/2022)   TETANUS/TDAP  12/28/2022 (Originally 07/16/2016)   HIV Screening  12/28/2022 (Originally 05/03/1998)   Hepatitis C Screening  Completed   HPV VACCINES  Aged Out     ----------------------------------------------------------------------------------------------------------------------------------------------------------------------------------------------------------------- Physical Exam BP (!) 130/94 (BP Location: Left Arm, Patient Position: Sitting, Cuff Size: Large)   Pulse 83   Ht 5\' 11"  (1.803 m)   Wt 234 lb (106.1 kg)   SpO2 98%   BMI 32.64 kg/m   Physical Exam Constitutional:      Appearance: Normal appearance.  Eyes:     General: No scleral icterus. Cardiovascular:     Rate and Rhythm: Normal rate and regular  rhythm.  Pulmonary:     Effort: Pulmonary effort is normal.     Breath sounds: Normal breath sounds.  Musculoskeletal:     Cervical back: Neck supple.  Neurological:     General: No focal deficit present.     Mental Status: He is alert.  Psychiatric:        Mood and Affect: Mood normal.        Behavior: Behavior normal.     ------------------------------------------------------------------------------------------------------------------------------------------------------------------------------------------------------------------- Assessment and Plan  Arthralgia Continues to have arthralgias.  He  did not have the labs completed ordered at previous visit.  We will go ahead and do these today.  X-rays of bilateral hands ordered.  Could potentially be occupational as he does carry a small iPad around and scrolling through this frequently at his job.  Changing meloxicam to diclofenac to see if this is more effective for him.   Meds ordered this encounter  Medications   diclofenac (VOLTAREN) 75 MG EC tablet    Sig: Take 1 tablet (75 mg total) by mouth 2 (two) times daily.    Dispense:  30 tablet    Refill:  1    No follow-ups on file.    This visit occurred during the SARS-CoV-2 public health emergency.  Safety protocols were in place, including screening questions prior to the visit, additional usage of staff PPE, and extensive cleaning of exam room while observing appropriate contact time as indicated for disinfecting solutions.

## 2022-04-12 LAB — SEDIMENTATION RATE: Sed Rate: 22 mm/h — ABNORMAL HIGH (ref 0–15)

## 2022-04-12 LAB — C-REACTIVE PROTEIN: CRP: 18 mg/L — ABNORMAL HIGH (ref <8.0)

## 2022-04-12 LAB — URIC ACID: Uric Acid, Serum: 4.9 mg/dL (ref 4.0–8.0)

## 2022-04-13 ENCOUNTER — Other Ambulatory Visit: Payer: Self-pay | Admitting: Family Medicine

## 2022-04-13 DIAGNOSIS — R7982 Elevated C-reactive protein (CRP): Secondary | ICD-10-CM

## 2022-04-13 DIAGNOSIS — M2550 Pain in unspecified joint: Secondary | ICD-10-CM

## 2022-05-02 DIAGNOSIS — F4323 Adjustment disorder with mixed anxiety and depressed mood: Secondary | ICD-10-CM | POA: Diagnosis not present

## 2022-05-03 DIAGNOSIS — F4323 Adjustment disorder with mixed anxiety and depressed mood: Secondary | ICD-10-CM | POA: Diagnosis not present

## 2022-05-17 DIAGNOSIS — F4323 Adjustment disorder with mixed anxiety and depressed mood: Secondary | ICD-10-CM | POA: Diagnosis not present

## 2022-05-21 ENCOUNTER — Other Ambulatory Visit: Payer: Self-pay | Admitting: Family Medicine

## 2022-06-11 ENCOUNTER — Encounter: Payer: Self-pay | Admitting: Family Medicine

## 2022-06-11 ENCOUNTER — Ambulatory Visit (INDEPENDENT_AMBULATORY_CARE_PROVIDER_SITE_OTHER): Payer: BC Managed Care – PPO | Admitting: Family Medicine

## 2022-06-11 VITALS — BP 133/85 | HR 118 | Ht 71.0 in | Wt 239.0 lb

## 2022-06-11 DIAGNOSIS — M4802 Spinal stenosis, cervical region: Secondary | ICD-10-CM | POA: Insufficient documentation

## 2022-06-11 MED ORDER — PREDNISONE 50 MG PO TABS: ORAL_TABLET | ORAL | 0 refills | Status: DC

## 2022-06-11 MED ORDER — GABAPENTIN 300 MG PO CAPS: 300.0000 mg | ORAL_CAPSULE | Freq: Three times a day (TID) | ORAL | 3 refills | Status: DC

## 2022-06-11 NOTE — Progress Notes (Signed)
James Mayer - 41 y.o. male MRN [id number]  Date of birth: 1980/09/13  Subjective Chief Complaint  Patient presents with   Shoulder Pain    HPI James Mayer is a 41 y.o. male here today with complaint of L shoulder pain.  Symptoms started several months ago with worsening recently over the past few weeks.  He has weakness of the L arm and shoulder.  He does have numbness and tingling into the arm.  He has tried tramadol without much relief..  [facility] which showed  severe left neural foraminal narrowing at C4-C5 and C5-C6 along with  severe neural foraminal narrowing 2/2 to large left subarticular disc protrusion at C7-T1.  This has not been reviewed with him yet.    He is also going through a divorce reports some increased stress related to this.  He is handling this fairly well at this time.  ROS:  A comprehensive ROS was completed and negative except as noted per HPI  No Known Allergies  Past Medical History:  Diagnosis Date   Hypertension    Low back pain     History reviewed. No pertinent surgical history.  Social History   Socioeconomic History   Marital status: Married    Spouse name: Abbi   Number of children: 4   Years of education: Not on file   Highest education level: Some college, no degree  Occupational History   Not on file  Tobacco Use   Smoking status: Never   Smokeless tobacco: Never  Substance and Sexual Activity   Alcohol use: Yes    Alcohol/week: 1.0 standard drink of alcohol    Types: 1 Shots of liquor per week    Comment: rarely   Drug use: No   Sexual activity: Yes    Birth control/protection: None  Other Topics Concern   Not on file  Social History Narrative   Live w wife and 2 daughter full time and 2 other children 60% of the time   Right handed   Caffeine: decaf tea and coffee   Social Determinants of Health   Financial Resource Strain: Not on file  Food Insecurity: Not on file  Transportation  Needs: Not on file  Physical Activity: Not on file  Stress: Not on file  Social Connections: Not on file    Family History  Problem Relation Age of Onset   Hypertension Father    Hyperlipidemia Father    Diabetes Sister    Diabetes Maternal Grandmother    Cancer Maternal Grandmother    Cancer Maternal Grandfather    Diabetes Paternal Grandmother    Neurologic Disorder Paternal Grandfather     Health Maintenance  Topic Date Due   COVID-19 Vaccine (3 - 2023-24 season) 08/16/2022 (Originally 03/16/2022)   INFLUENZA VACCINE  10/14/2022 (Originally 02/13/2022)   HIV Screening  12/28/2022 (Originally 05/03/1998)   Hepatitis C Screening  Completed   HPV VACCINES  Aged Out     ----------------------------------------------------------------------------------------------------------------------------------------------------------------------------------------------------------------- Physical Exam BP 133/85 (BP Location: Left Arm, Patient Position: Sitting, Cuff Size: Large)   Pulse (!) 118   Ht 5\' 11"  (1.803 m)   Wt 239 lb (108.4 kg)   SpO2 94%   BMI 33.33 kg/m   Physical Exam Constitutional:      Appearance: Normal appearance.  HENT:     Head: Normocephalic and atraumatic.  Eyes:     General: No scleral icterus. Cardiovascular:     Rate and Rhythm: Normal rate  and regular rhythm.  Pulmonary:     Effort: Pulmonary effort is normal.     Breath sounds: Normal breath sounds.  Musculoskeletal:     Comments: Weakness with left abduction of the shoulder.  Rotator cuff strength is normal.  Neurological:     General: No focal deficit present.     Mental Status: He is alert.  Psychiatric:        Mood and Affect: Mood normal.        Behavior: Behavior normal.      ------------------------------------------------------------------------------------------------------------------------------------------------------------------------------------------------------------------- Assessment and Plan  Cervical spinal stenosis He does have pain and weakness some more of a C5-C6 distribution.  Adding referral to physical therapy.  Adding prednisone burst.  We will go ahead and place referral to neurosurgery as well.   Meds ordered this encounter  Medications   predniSONE (DELTASONE) 50 MG tablet    Sig: Take 50mg  daily x5 days.    Dispense:  5 tablet    Refill:  0   gabapentin (NEURONTIN) 300 MG capsule    Sig: Take 1 capsule (300 mg total) by mouth 3 (three) times daily. Take 300mg  qhs x1 week then may increase to 300mg  TID as needed.    Dispense:  90 capsule    Refill:  3    No follow-ups on file.    This visit occurred during the SARS-CoV-2 public health emergency.  Safety protocols were in place, including screening questions prior to the visit, additional usage of staff PPE, and extensive cleaning of exam room while observing appropriate contact time as indicated for disinfecting solutions.

## 2022-06-11 NOTE — Assessment & Plan Note (Signed)
He does have pain and weakness some more of a C5-C6 distribution.  Adding referral to physical therapy.  Adding prednisone burst.  We will go ahead and place referral to neurosurgery as well.

## 2022-06-11 NOTE — Patient Instructions (Signed)
Start prednisone and gabapentin.

## 2022-06-12 ENCOUNTER — Ambulatory Visit: Payer: No Typology Code available for payment source | Admitting: Rehabilitative and Restorative Service Providers"

## 2022-06-14 DIAGNOSIS — F4323 Adjustment disorder with mixed anxiety and depressed mood: Secondary | ICD-10-CM | POA: Diagnosis not present

## 2022-06-18 ENCOUNTER — Ambulatory Visit: Payer: BC Managed Care – PPO | Attending: Family Medicine | Admitting: Rehabilitative and Restorative Service Providers"

## 2022-06-18 ENCOUNTER — Other Ambulatory Visit: Payer: Self-pay

## 2022-06-18 DIAGNOSIS — M6281 Muscle weakness (generalized): Secondary | ICD-10-CM | POA: Diagnosis present

## 2022-06-18 DIAGNOSIS — R29898 Other symptoms and signs involving the musculoskeletal system: Secondary | ICD-10-CM | POA: Diagnosis not present

## 2022-06-18 DIAGNOSIS — M539 Dorsopathy, unspecified: Secondary | ICD-10-CM | POA: Diagnosis not present

## 2022-06-18 DIAGNOSIS — M4802 Spinal stenosis, cervical region: Secondary | ICD-10-CM | POA: Insufficient documentation

## 2022-06-18 DIAGNOSIS — R293 Abnormal posture: Secondary | ICD-10-CM | POA: Insufficient documentation

## 2022-06-18 NOTE — Therapy (Signed)
OUTPATIENT PHYSICAL THERAPY CERVICAL EVALUATION   Patient Name: James Mayer MRN: [id number] DOB:[birth year], 41 y.o., male Today's Date: 06/18/2022  END OF SESSION:  PT End of Session - 06/18/22 1046     Visit Number 1    Number of Visits 16    Date for PT Re-Evaluation 08/13/22    PT Start Time 0814   pt late for appt   PT Stop Time 0849    PT Time Calculation (min) 35 min    Activity Tolerance Patient tolerated treatment well             Past Medical History:  Diagnosis Date   Hypertension    Low back pain    No past surgical history on file. Patient Active Problem List   Diagnosis Date Noted   Cervical spinal stenosis 06/11/2022   Serous otitis media 09/27/2021   Tinnitus, bilateral 02/06/2021   Anxiety disorder, unspecified 02/06/2021   Conduct disorder, unspecified 02/06/2021   Restless legs 01/15/2021   Neck pain 08/22/2020   Idiopathic peripheral neuropathy 08/22/2020   Cervical spondylosis 08/22/2020   Bronchitis 08/22/2020   Acute low back pain 01/27/2020   Acute left ankle pain 08/28/2019   Polydipsia 07/31/2019   Arthralgia 04/14/2019   Essential hypertension 08/26/2018   Chronic low back pain 08/26/2018    PCP: Dr Everrett Coombe  REFERRING PROVIDER: Dr Everrett Coombe  REFERRING DIAG: Cervical spinal stenosis; Lt shoulder pain/weakness   THERAPY DIAG:  Cervical dysfunction - Plan: PT plan of care cert/re-cert  Other symptoms and signs involving the musculoskeletal system - Plan: PT plan of care cert/re-cert  Abnormal posture - Plan: PT plan of care cert/re-cert  Muscle weakness (generalized) - Plan: PT plan of care cert/re-cert  Rationale for Evaluation and Treatment: Rehabilitation  ONSET DATE: 05/25/22  SUBJECTIVE:                                                                                                                                                                                                         SUBJECTIVE  STATEMENT: Patient reports that he has had pain in the neck and Lt > Rt UE with no known injury. He has a history of neck problems for the past 16 years following service in the [employer]. He managed symptoms over the years following PT. He has had pain in the LB for several years.   PERTINENT HISTORY:  HTN; chronic LBP and intermittent cervical pain and radicular symptoms   PAIN:  Are you having pain? Yes: NPRS scale: 3/10 Pain location: neck and Lt  UE in shoulder area into the back of the neck  Pain description: nagging; sharp with activity Aggravating factors: reaching out to side; lifting; reaching up; weight bearing UE's  Relieving factors: lying on Lt side; meds  PRECAUTIONS: None  WEIGHT BEARING RESTRICTIONS: No  FALLS:  Has patient fallen in last 6 months? 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  Does some household chores; computer; sitting in soft couch when relaxing  PLOF: Independent  PATIENT GOALS: get strength back in the Lt shoulder; postpone surgery as long as possible   NEXT MD VISIT: to schedule   OBJECTIVE:   DIAGNOSTIC FINDINGS:  MRI showed severe left neural foraminal narrowing at C4-C5 and C5-C6 along with severe neural foraminal narrowing 2/2 to large left subarticular disc protrusion at C7-T1  COGNITION: Overall cognitive status: Within functional limits for tasks assessed  SENSATION: Intermittent tingling and numbness in the Lt UE - mostly pain radiating into the Lt at times to finger tips    POSTURE: Patient presents with head forward posture with increased thoracic kyphosis; shoulders rounded and elevated; scapulae abducted and rotated along the thoracic spine; head of the humerus anterior in orientation.   PALPATION: Muscular tightness Lt > Rt ant/lat/post cervical  musculature; pecs; upper trap. Tenderness to PA mobs through the mid thoracic spine    CERVICAL ROM:  Symptoms on Lt  - sore and tight throughout  Active ROM A/PROM (deg) eval  Flexion 44  Extension  50  Right lateral flexion 36 sore  Left lateral flexion 36 sore   Right rotation 64  Left rotation 64   (Blank rows = not tested)  UPPER EXTREMITY ROM:  Active ROM Right eval Left eval  Shoulder flexion 147 117 tight/ painful   Shoulder extension 65 41 sore  Shoulder abduction 163 115 tight/ painful  Shoulder adduction    Shoulder extension    Shoulder internal rotation    Shoulder external rotation 80 shoulder 90 deg/elbow 90 deg 60 shoulder 90/elbow 90 deg - tight; painful  Elbow flexion    Elbow extension    Wrist flexion    Wrist extension    Wrist ulnar deviation    Wrist radial deviation    Wrist pronation    Wrist supination     (Blank rows = not tested)  UPPER EXTREMITY MMT:  MMT Right eval Left eval  Shoulder flexion 5/5 3+/5  Shoulder extension    Shoulder abduction 5/5 3-/5  Shoulder adduction    Shoulder extension    Shoulder internal rotation 5/5 4/5  Shoulder external rotation 5/5  4-/5  Middle trapezius    Lower trapezius    Elbow flexion    Elbow extension    Wrist flexion 5/5 5/5  Wrist extension 5/5 5-/5  Wrist ulnar deviation    Wrist radial deviation    Wrist pronation    Wrist supination    Grip strength     (Blank rows = not tested)  CERVICAL SPECIAL TESTS:  Compression/distraction - negative    [facility] Adult PT Treatment:                                                DATE:  06/18/22 Therapeutic Exercise: Chin tuck 5 sec x 5 Scapu squeeze 10 sec x 5 Bilat shoulder ER elbows 90 deg x 10  Pec stretch lower level 30 sec x 2  Manual Therapy:  Neuromuscular re-ed: Working on posture and alignment Therapeutic Activity: Worked on sitting posture using noodle and coregeous  Modalities:  Self Care: Handouts for desk  posture/alignment; DN; TENS  PATIENT EDUCATION:  Education details: POC; HEP  Person educated: Patient Education method: Programmer, multimediaxplanation, Demonstration, ActorTactile cues, Verbal cues, and Handouts Education comprehension: verbalized understanding, returned demonstration, verbal cues required, tactile cues required, and needs further education  HOME EXERCISE PROGRAM: Access Code: [email or web address] URL: [email or web address] Date: 06/18/2022 Prepared by: Corlis Leakelyn Qasim Diveley  Exercises - Seated Cervical Retraction  - 3 x daily - 7 x weekly - 1 sets - 10 reps - Standing Scapular Retraction  - 3 x daily - 7 x weekly - 1 sets - 10 reps - 10 hold - Shoulder External Rotation and Scapular Retraction  - 3 x daily - 7 x weekly - 1 sets - 10 reps -   hold - Doorway Pec Stretch at 60 Degrees Abduction  - 3 x daily - 7 x weekly - 1 sets - 3 reps - Doorway Pec Stretch at 90 Degrees Abduction  - 3 x daily - 7 x weekly - 1 sets - 3 reps - 30 seconds  hold - Doorway Pec Stretch at 120 Degrees Abduction  - 3 x daily - 7 x weekly - 1 sets - 3 reps - 30 second hold  hold - Standing Pectoral Release with Shoulder Abduction with Ball at Wall  - 2-3 x daily - 7 x weekly  Patient Education - TENS Unit - Trigger Point Dry Needling - Office Posture  ASSESSMENT:  CLINICAL IMPRESSION: Patient is a 41 y.o. male who was seen today for physical therapy evaluation and treatment for cervical spinal stenosis and Lt shoulder pain, weakness, numbness. He presents with poor posture and alignment; limited cervical and shoulder ROM/mobility; Lt UE weakness; muscular tightness to palpation; pain limiting functional activities; limited activity level.    OBJECTIVE IMPAIRMENTS: decreased activity tolerance, decreased mobility, decreased ROM, decreased strength, increased fascial restrictions, increased muscle spasms, impaired flexibility, impaired sensation, impaired UE functional use, improper body mechanics, postural dysfunction, and  pain.   ACTIVITY LIMITATIONS: carrying, lifting, bending, sitting, and sleeping  PARTICIPATION LIMITATIONS: community activity, occupation, yard work, and standing; sleeping   PERSONAL FACTORS: Fitness, Past/current experiences, Time since onset of injury/illness/exacerbation, and comorbidity: HTN are also affecting patient's functional outcome.   REHAB POTENTIAL: Good  CLINICAL DECISION MAKING: Stable/uncomplicated  EVALUATION COMPLEXITY: Low   GOALS: Goals reviewed with patient? Yes  SHORT TERM GOALS: Target date: 07/17/2022   Independent in initial HEP  Baseline: no exercises Goal status: INITIAL  2.  Verbalizes and/or demonstrates proper sitting w/ assist as needed  Baseline: poor posture in sitting  Goal status: INITIAL  LONG TERM GOALS: Target date: 08/13/2022   Improve posture and alignment with patient to demonstrated improved alignment with posterior shoulder girdle engaged Baseline: see eval Goal status: INITIAL  2.  Increase AROM Lt shoulder to within 5 degrees of AROM Rt shoulder with minimal to no pain Baseline:  Goal status: INITIAL  3.  Increased AROM cervical spine in all planes by 3-5 degrees with minimal to no pain  Baseline:  Goal status: INITIAL  4.  Increase strength Lt UE to =/> 4/5 in all planes  Baseline: see eval Goal status: INITIAL  5.  Independent in HEP Baseline:  Goal status: INITIAL   PLAN:  PT FREQUENCY: 2x/week  PT DURATION: 8 weeks  PLANNED INTERVENTIONS: Therapeutic exercises, Therapeutic activity, Neuromuscular re-education, Patient/Family education, Self Care, Joint mobilization, Aquatic Therapy, Dry Needling, Spinal mobilization, Cryotherapy, Moist heat, Taping, Traction, Ultrasound, Ionotophoresis 4mg /ml Dexamethasone, Manual therapy, and Re-evaluation  PLAN FOR NEXT SESSION: review and progress HEP; continue with postural education/correction; manual work and/or dry needling as indicated; modalities as indicated - Check  grip strength   Jajuan Skoog , PT, MPH  06/18/2022, 10:47 AM

## 2022-06-20 ENCOUNTER — Ambulatory Visit: Payer: BC Managed Care – PPO | Admitting: Physical Therapy

## 2022-06-20 ENCOUNTER — Encounter: Payer: Self-pay | Admitting: Physical Therapy

## 2022-06-20 DIAGNOSIS — M6281 Muscle weakness (generalized): Secondary | ICD-10-CM | POA: Diagnosis not present

## 2022-06-20 DIAGNOSIS — R293 Abnormal posture: Secondary | ICD-10-CM

## 2022-06-20 DIAGNOSIS — M539 Dorsopathy, unspecified: Secondary | ICD-10-CM

## 2022-06-20 DIAGNOSIS — R29898 Other symptoms and signs involving the musculoskeletal system: Secondary | ICD-10-CM

## 2022-06-20 NOTE — Therapy (Signed)
OUTPATIENT PHYSICAL THERAPY CERVICAL EVALUATION   Patient Name: James Mayer MRN: [id number] DOB:1980/10/18, 41 y.o., male Today's Date: 06/20/2022  END OF SESSION:  PT End of Session - 06/20/22 0805     Visit Number 2    Number of Visits 16    Date for PT Re-Evaluation 08/13/22    PT Start Time 0805    PT Stop Time 0845    PT Time Calculation (min) 40 min    Activity Tolerance Patient tolerated treatment well    Behavior [facility] for tasks assessed/performed             Past Medical History:  Diagnosis Date   Hypertension    Low back pain    History reviewed. No pertinent surgical history. Patient Active Problem List   Diagnosis Date Noted   Cervical spinal stenosis 06/11/2022   Serous otitis media 09/27/2021   Tinnitus, bilateral 02/06/2021   Anxiety disorder, unspecified 02/06/2021   Conduct disorder, unspecified 02/06/2021   Restless legs 01/15/2021   Neck pain 08/22/2020   Idiopathic peripheral neuropathy 08/22/2020   Cervical spondylosis 08/22/2020   Bronchitis 08/22/2020   Acute low back pain 01/27/2020   Acute left ankle pain 08/28/2019   Polydipsia 07/31/2019   Arthralgia 04/14/2019   Essential hypertension 08/26/2018   Chronic low back pain 08/26/2018    PCP: Dr Everrett Coombeody Matthews  REFERRING PROVIDER: Dr Everrett Coombeody Matthews  REFERRING DIAG: Cervical spinal stenosis; Lt shoulder pain/weakness   THERAPY DIAG:  Cervical dysfunction  Other symptoms and signs involving the musculoskeletal system  Abnormal posture  Muscle weakness (generalized)  Rationale for Evaluation and Treatment: Rehabilitation  ONSET DATE: 05/25/22  SUBJECTIVE:                                                                                                                                                                                                         SUBJECTIVE STATEMENT: Pt states he was able to try the exercises a little bit. Neck and shoulder "feel  okay." Mornings are typically easier for pain.   PERTINENT HISTORY:  HTN; chronic LBP and intermittent cervical pain and radicular symptoms   PAIN:  Are you having pain? Yes: NPRS scale: 3/10 Pain location: neck and Lt UE in shoulder area into the back of the neck  Pain description: nagging; sharp with activity Aggravating factors: reaching out to side; lifting; reaching up; weight bearing UE's  Relieving factors: lying on Lt side; meds  PRECAUTIONS: None  WEIGHT BEARING RESTRICTIONS: No  FALLS:  Has patient fallen in last 6  months? 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  Does some household chores; computer; sitting in soft couch when relaxing  PATIENT GOALS: get strength back in the Lt shoulder; postpone surgery as long as possible   NEXT MD VISIT: to schedule   OBJECTIVE:   DIAGNOSTIC FINDINGS:  MRI showed severe left neural foraminal narrowing at C4-C5 and C5-C6 along with severe neural foraminal narrowing 2/2 to large left subarticular disc protrusion at C7-T1  SENSATION: Intermittent tingling and numbness in the Lt UE - mostly pain radiating into the Lt at times to finger tips    PALPATION: Muscular tightness Lt > Rt ant/lat/post cervical musculature; pecs; upper trap. Tenderness to PA mobs through the mid thoracic spine    CERVICAL ROM:  Symptoms on Lt  - sore and tight throughout  Active ROM A/PROM (deg) eval  Flexion 44  Extension  50  Right lateral flexion 36 sore  Left lateral flexion 36 sore   Right rotation 64  Left rotation 64   (Blank rows = not tested)  UPPER EXTREMITY ROM:  Active ROM Right eval Left eval  Shoulder flexion 147 117 tight/ painful   Shoulder extension 65 41 sore  Shoulder abduction 163 115 tight/ painful  Shoulder adduction    Shoulder extension    Shoulder internal rotation    Shoulder external rotation 80 shoulder 90 deg/elbow 90 deg  60 shoulder 90/elbow 90 deg - tight; painful  Elbow flexion    Elbow extension    Wrist flexion    Wrist extension    Wrist ulnar deviation    Wrist radial deviation    Wrist pronation    Wrist supination     (Blank rows = not tested)  UPPER EXTREMITY MMT:  MMT Right eval Left eval  Shoulder flexion 5/5 3+/5  Shoulder extension    Shoulder abduction 5/5 3-/5  Shoulder adduction    Shoulder extension    Shoulder internal rotation 5/5 4/5  Shoulder external rotation 5/5  4-/5  Middle trapezius    Lower trapezius    Elbow flexion    Elbow extension    Wrist flexion 5/5 5/5  Wrist extension 5/5 5-/5  Wrist ulnar deviation    Wrist radial deviation    Wrist pronation    Wrist supination    Grip strength     (Blank rows = not tested)  CERVICAL SPECIAL TESTS:  Compression/distraction - negative   [facility] Adult PT Treatment:                                                DATE: 06/20/22 Therapeutic Exercise: UBE L2; 2 min fwd, 2 min bwd Standing doorway pec stretch low, mid, high 2x30 sec each Standing against pool noodle Cervical retraction x10 Scap retraction x10 Scap retraction + shoulder ER 2x10 "W" 2x10 Sitting Thoracic extensions against chair x10 Supine Open/close book x10 Manual Therapy: STM and TPR for L UTs, pecs, cervical and thoracic paraspinals Thoracic PAs grade II to III   [facility] Adult PT Treatment:  DATE: 06/18/22 Therapeutic Exercise: Chin tuck 5 sec x 5 Scapu squeeze 10 sec x 5 Bilat shoulder ER elbows 90 deg x 10  Pec stretch lower level 30 sec x 2  Neuromuscular re-ed: Working on posture and alignment Therapeutic Activity: Worked on sitting posture using noodle and coregeous Self Care: Handouts for desk posture/alignment; DN; TENS   PATIENT EDUCATION:  Education details: POC; HEP  Person educated: Patient Education method: Programmer, multimedia, Demonstration, Actor cues, Verbal cues, and  Handouts Education comprehension: verbalized understanding, returned demonstration, verbal cues required, tactile cues required, and needs further education  HOME EXERCISE PROGRAM: Access Code: [email or web address] URL: [email or web address] Date: 06/20/2022 Prepared by: Vernon Prey April Kirstie Peri  Exercises - Seated Cervical Retraction  - 3 x daily - 7 x weekly - 1 sets - 10 reps - Standing Scapular Retraction  - 3 x daily - 7 x weekly - 1 sets - 10 reps - 10 hold - Shoulder External Rotation and Scapular Retraction  - 3 x daily - 7 x weekly - 1 sets - 10 reps -   hold - Doorway Pec Stretch at 60 Degrees Abduction  - 3 x daily - 7 x weekly - 1 sets - 3 reps - Doorway Pec Stretch at 90 Degrees Abduction  - 3 x daily - 7 x weekly - 1 sets - 3 reps - 30 seconds  hold - Doorway Pec Stretch at 120 Degrees Abduction  - 3 x daily - 7 x weekly - 1 sets - 3 reps - 30 second hold  hold - Standing Pectoral Release with Shoulder Abduction with Ball at Wall  - 2-3 x daily - 7 x weekly - Sidelying Thoracic Rotation with Open Book  - 1 x daily - 7 x weekly - 1 sets - 10 reps - 3 sec hold - Seated Thoracic Lumbar Extension with Pectoralis Stretch  - 1 x daily - 7 x weekly - 1 sets - 10 reps - 3 sec hold  ASSESSMENT:  CLINICAL IMPRESSION: Treatment focused on reviewing HEP, manual work to address thoracic hypomobility and trigger point release of muscular tightness. Worked on postural stability and initiate exercises to improve thoracic mobility.  OBJECTIVE IMPAIRMENTS: decreased activity tolerance, decreased mobility, decreased ROM, decreased strength, increased fascial restrictions, increased muscle spasms, impaired flexibility, impaired sensation, impaired UE functional use, improper body mechanics, postural dysfunction, and pain.    GOALS: Goals reviewed with patient? Yes  SHORT TERM GOALS: Target date: 07/17/2022   Independent in initial HEP  Baseline: no exercises Goal status: INITIAL  2.   Verbalizes and/or demonstrates proper sitting w/ assist as needed  Baseline: poor posture in sitting  Goal status: INITIAL  LONG TERM GOALS: Target date: 08/13/2022   Improve posture and alignment with patient to demonstrated improved alignment with posterior shoulder girdle engaged Baseline: see eval Goal status: INITIAL  2.  Increase AROM Lt shoulder to within 5 degrees of AROM Rt shoulder with minimal to no pain Baseline:  Goal status: INITIAL  3.  Increased AROM cervical spine in all planes by 3-5 degrees with minimal to no pain  Baseline:  Goal status: INITIAL  4.  Increase strength Lt UE to =/> 4/5 in all planes  Baseline: see eval Goal status: INITIAL  5.  Independent in HEP Baseline:  Goal status: INITIAL   PLAN:  PT FREQUENCY: 2x/week  PT DURATION: 8 weeks  PLANNED INTERVENTIONS: Therapeutic exercises, Therapeutic activity, Neuromuscular re-education, Patient/Family education, Self Care, Joint mobilization, Aquatic Therapy, Dry Needling, Spinal mobilization,  Cryotherapy, Moist heat, Taping, Traction, Ultrasound, Ionotophoresis 4mg /ml Dexamethasone, Manual therapy, and Re-evaluation  PLAN FOR NEXT SESSION: review and progress HEP; continue with postural education/correction; manual work and/or dry needling as indicated; modalities as indicated - Check grip strength   Dawaun Brancato April Ma L Shriyan Arakawa, PT, DPT 06/20/2022, 8:06 AM

## 2022-06-26 ENCOUNTER — Encounter: Payer: Self-pay | Admitting: Rehabilitative and Restorative Service Providers"

## 2022-06-26 ENCOUNTER — Ambulatory Visit: Payer: BC Managed Care – PPO | Admitting: Rehabilitative and Restorative Service Providers"

## 2022-06-26 DIAGNOSIS — R293 Abnormal posture: Secondary | ICD-10-CM

## 2022-06-26 DIAGNOSIS — M6281 Muscle weakness (generalized): Secondary | ICD-10-CM | POA: Diagnosis not present

## 2022-06-26 DIAGNOSIS — R29898 Other symptoms and signs involving the musculoskeletal system: Secondary | ICD-10-CM

## 2022-06-26 DIAGNOSIS — M539 Dorsopathy, unspecified: Secondary | ICD-10-CM

## 2022-06-26 NOTE — Therapy (Signed)
OUTPATIENT PHYSICAL THERAPY CERVICAL EVALUATION   Patient Name: James BleakJason D Mayer MRN: [id number] DOB:10-07-1980, 41 y.o., male Today's Date: 06/26/2022  END OF SESSION:  PT End of Session - 06/26/22 0804     Visit Number 3    Number of Visits 16    Date for PT Re-Evaluation 08/13/22    PT Start Time 0803    PT Stop Time 0845    PT Time Calculation (min) 42 min    Activity Tolerance Patient tolerated treatment well             Past Medical History:  Diagnosis Date   Hypertension    Low back pain    History reviewed. No pertinent surgical history. Patient Active Problem List   Diagnosis Date Noted   Cervical spinal stenosis 06/11/2022   Serous otitis media 09/27/2021   Tinnitus, bilateral 02/06/2021   Anxiety disorder, unspecified 02/06/2021   Conduct disorder, unspecified 02/06/2021   Restless legs 01/15/2021   Neck pain 08/22/2020   Idiopathic peripheral neuropathy 08/22/2020   Cervical spondylosis 08/22/2020   Bronchitis 08/22/2020   Acute low back pain 01/27/2020   Acute left ankle pain 08/28/2019   Polydipsia 07/31/2019   Arthralgia 04/14/2019   Essential hypertension 08/26/2018   Chronic low back pain 08/26/2018    PCP: Dr Everrett Coombeody Matthews  REFERRING PROVIDER: Dr Everrett Coombeody Matthews  REFERRING DIAG: Cervical spinal stenosis; Lt shoulder pain/weakness   THERAPY DIAG:  Cervical dysfunction  Other symptoms and signs involving the musculoskeletal system  Abnormal posture  Muscle weakness (generalized)  Rationale for Evaluation and Treatment: Rehabilitation  ONSET DATE: 05/25/22  SUBJECTIVE:                                                                                                                                                                                                         SUBJECTIVE STATEMENT: Patient reports that he had a bad day yesterday with increased pain that lasted all day long. He has been working for a 10 day stretch but does  not know if that may have increased symptoms. Has done the exercises some at home and tries to do the doorway stretch at work when he can. Symptoms are still worse toward the end of the day.  PERTINENT HISTORY:  HTN; chronic LBP and intermittent cervical pain and radicular symptoms   PAIN:  Are you having pain? Yes: NPRS scale: 2/10 Pain location: neck and Lt UE in shoulder area into the back of the neck  Pain description: nagging; sharp with activity Aggravating factors: reaching out to side;  lifting; reaching up; weight bearing UE's  Relieving factors: lying on Lt side; meds  PRECAUTIONS: None  WEIGHT BEARING RESTRICTIONS: No  FALLS:  Has patient fallen in last 6 months? 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  Does some household chores; computer; sitting in soft couch when relaxing  PATIENT GOALS: get strength back in the Lt shoulder; postpone surgery as long as possible   NEXT MD VISIT: to schedule   OBJECTIVE:   DIAGNOSTIC FINDINGS:  MRI showed severe left neural foraminal narrowing at C4-C5 and C5-C6 along with severe neural foraminal narrowing 2/2 to large left subarticular disc protrusion at C7-T1  SENSATION: Intermittent tingling and numbness in the Lt UE - mostly pain radiating into the Lt at times to finger tips    PALPATION: Muscular tightness Lt > Rt ant/lat/post cervical musculature; pecs; upper trap. Tenderness to PA mobs through the mid thoracic spine    CERVICAL ROM:  Symptoms on Lt  - sore and tight throughout  Active ROM A/PROM (deg) eval  Flexion 44  Extension  50  Right lateral flexion 36 sore  Left lateral flexion 36 sore   Right rotation 64  Left rotation 64   (Blank rows = not tested)  UPPER EXTREMITY ROM:  Active ROM Right eval Left eval  Shoulder flexion 147 117 tight/ painful   Shoulder extension 65 41 sore  Shoulder abduction 163 115 tight/  painful  Shoulder adduction    Shoulder extension    Shoulder internal rotation    Shoulder external rotation 80 shoulder 90 deg/elbow 90 deg 60 shoulder 90/elbow 90 deg - tight; painful  Elbow flexion    Elbow extension    Wrist flexion    Wrist extension    Wrist ulnar deviation    Wrist radial deviation    Wrist pronation    Wrist supination     (Blank rows = not tested)  UPPER EXTREMITY MMT:  MMT Right eval Left eval  Shoulder flexion 5/5 3+/5  Shoulder extension    Shoulder abduction 5/5 3-/5  Shoulder adduction    Shoulder extension    Shoulder internal rotation 5/5 4/5  Shoulder external rotation 5/5  4-/5  Middle trapezius    Lower trapezius    Elbow flexion    Elbow extension    Wrist flexion 5/5 5/5  Wrist extension 5/5 5-/5  Wrist ulnar deviation    Wrist radial deviation    Wrist pronation    Wrist supination    Grip strength     (Blank rows = not tested)  CERVICAL SPECIAL TESTS:  Compression/distraction - negative   GRIP STRENGTH: 06/26/22: Rt 55 #; Lt 42 #  [facility] Adult PT Treatment:                                                 Date 06/26/22:  Therapeutic Exercise: UBE L4; 2 min fwd, 2 min bwd Standing doorway pec stretch low, mid, high 2x30 sec each Standing against pool noodle Cervical retraction x10 Scap retraction x10 Scap retraction + shoulder ER 2x10 "W" 2x10 ER red TB increased pain Lt shoulder  Sitting Thoracic extensions in chair coregeous ball x10  Manual Therapy:  STM through the ant/lat/post cervical musculature; pecs; upper trap; biceps/anterior shoulder pt  supine  Modalities: TENS Lt cervical and upper trap x 15 min Moist heat cervical and Lt shoulder   DATE: 06/20/22 Therapeutic Exercise: UBE L2; 2 min fwd, 2 min bwd Standing doorway pec stretch low, mid, high 2x30 sec each Standing against pool noodle Cervical retraction x10 Scap retraction x10 Scap retraction + shoulder ER 2x10 "W" 2x10 Sitting Thoracic  extensions against chair x10 Lateral cervical flexion 10 sec x 3 reps  Supine Supine Open/close book x10 Manual Therapy:  STM and TPR for L UTs, pecs, cervical and thoracic paraspinals Thoracic PAs grade II to III  PATIENT EDUCATION:  Education details: POC; HEP  Person educated: Patient Education method: Programmer, multimedia, Facilities manager, Actor cues, Verbal cues, and Handouts Education comprehension: verbalized understanding, returned demonstration, verbal cues required, tactile cues required, and needs further education  HOME EXERCISE PROGRAM: Access Code: [email or web address] URL: [email or web address] Date: 06/26/2022 Prepared by: Corlis Leak  Exercises - Seated Cervical Retraction  - 3 x daily - 7 x weekly - 1 sets - 10 reps - Standing Scapular Retraction  - 3 x daily - 7 x weekly - 1 sets - 10 reps - 10 hold - Shoulder External Rotation and Scapular Retraction  - 3 x daily - 7 x weekly - 1 sets - 10 reps -   hold - Doorway Pec Stretch at 60 Degrees Abduction  - 3 x daily - 7 x weekly - 1 sets - 3 reps - Doorway Pec Stretch at 90 Degrees Abduction  - 3 x daily - 7 x weekly - 1 sets - 3 reps - 30 seconds  hold - Doorway Pec Stretch at 120 Degrees Abduction  - 3 x daily - 7 x weekly - 1 sets - 3 reps - 30 second hold  hold - Standing Pectoral Release with Shoulder Abduction with Ball at Wall  - 2-3 x daily - 7 x weekly - Sidelying Thoracic Rotation with Open Book  - 1 x daily - 7 x weekly - 1 sets - 10 reps - 3 sec hold - Seated Thoracic Lumbar Extension with Pectoralis Stretch  - 1 x daily - 7 x weekly - 1 sets - 10 reps - 3 sec hold - Seated Cervical Sidebending AROM  - 2 x daily - 7 x weekly - 1 sets - 5 reps - 5-10 sec  hold EDUCATION:  Dry needling    ASSESSMENT:  CLINICAL IMPRESSION: Measured decreased grip strength Lt compared to Rt. Continued treatment to address posture and alignment and improve tissue extensibility with exercises and manual work. Reviewed HEP,  continued with manual work to address thoracic hypomobility and trigger point release of muscular tightness. Worked on postural stability, thoracic mobility, posterior shoulder girdle strengthening. Trial of TENS unit for pain management   OBJECTIVE IMPAIRMENTS: decreased activity tolerance, decreased mobility, decreased ROM, decreased strength, increased fascial restrictions, increased muscle spasms, impaired flexibility, impaired sensation, impaired UE functional use, improper body mechanics, postural dysfunction, and pain.    GOALS: Goals reviewed with patient? Yes  SHORT TERM GOALS: Target date: 07/17/2022   Independent in initial HEP  Baseline: no exercises Goal status: INITIAL  2.  Verbalizes and/or demonstrates proper sitting w/ assist as needed  Baseline: poor posture in sitting  Goal status: INITIAL  LONG TERM GOALS: Target date: 08/13/2022   Improve posture and alignment with patient to demonstrated improved alignment with posterior shoulder girdle engaged Baseline: see eval Goal status: INITIAL  2.  Increase AROM Lt shoulder to within 5 degrees of AROM Rt  shoulder with minimal to no pain Baseline:  Goal status: INITIAL  3.  Increased AROM cervical spine in all planes by 3-5 degrees with minimal to no pain  Baseline:  Goal status: INITIAL  4.  Increase strength Lt UE to =/> 4/5 in all planes  Baseline: see eval Goal status: INITIAL  5.  Independent in HEP Baseline:  Goal status: INITIAL   PLAN:  PT FREQUENCY: 2x/week  PT DURATION: 8 weeks  PLANNED INTERVENTIONS: Therapeutic exercises, Therapeutic activity, Neuromuscular re-education, Patient/Family education, Self Care, Joint mobilization, Aquatic Therapy, Dry Needling, Spinal mobilization, Cryotherapy, Moist heat, Taping, Traction, Ultrasound, Ionotophoresis 4mg /ml Dexamethasone, Manual therapy, and Re-evaluation  PLAN FOR NEXT SESSION: review and progress HEP; continue with postural education/correction;  manual work and/or dry needling as indicated; modalities as indicated; assess response to TENS for pain management   Jozi Malachi , PT, MPH 06/26/2022, 8:46 AM

## 2022-06-28 ENCOUNTER — Encounter: Payer: No Typology Code available for payment source | Admitting: Rehabilitative and Restorative Service Providers"

## 2022-06-28 DIAGNOSIS — F4323 Adjustment disorder with mixed anxiety and depressed mood: Secondary | ICD-10-CM | POA: Diagnosis not present

## 2022-06-29 ENCOUNTER — Encounter: Payer: Self-pay | Admitting: Rehabilitative and Restorative Service Providers"

## 2022-06-29 ENCOUNTER — Ambulatory Visit: Payer: BC Managed Care – PPO | Admitting: Rehabilitative and Restorative Service Providers"

## 2022-06-29 DIAGNOSIS — M6281 Muscle weakness (generalized): Secondary | ICD-10-CM

## 2022-06-29 DIAGNOSIS — R293 Abnormal posture: Secondary | ICD-10-CM

## 2022-06-29 DIAGNOSIS — M539 Dorsopathy, unspecified: Secondary | ICD-10-CM

## 2022-06-29 DIAGNOSIS — R29898 Other symptoms and signs involving the musculoskeletal system: Secondary | ICD-10-CM

## 2022-06-29 NOTE — Therapy (Signed)
OUTPATIENT PHYSICAL THERAPY CERVICAL EVALUATION   Patient Name: KHIRY PASQUARIELLO MRN: [id number] DOB:1981-06-22, 41 y.o., male Today's Date: 06/29/2022  END OF SESSION:  PT End of Session - 06/29/22 0848     Visit Number 4    Number of Visits 16    Date for PT Re-Evaluation 08/13/22    PT Start Time 0847    PT Stop Time 0930    PT Time Calculation (min) 43 min             Past Medical History:  Diagnosis Date   Hypertension    Low back pain    History reviewed. No pertinent surgical history. Patient Active Problem List   Diagnosis Date Noted   Cervical spinal stenosis 06/11/2022   Serous otitis media 09/27/2021   Tinnitus, bilateral 02/06/2021   Anxiety disorder, unspecified 02/06/2021   Conduct disorder, unspecified 02/06/2021   Restless legs 01/15/2021   Neck pain 08/22/2020   Idiopathic peripheral neuropathy 08/22/2020   Cervical spondylosis 08/22/2020   Bronchitis 08/22/2020   Acute low back pain 01/27/2020   Acute left ankle pain 08/28/2019   Polydipsia 07/31/2019   Arthralgia 04/14/2019   Essential hypertension 08/26/2018   Chronic low back pain 08/26/2018    PCP: Dr Everrett Coombe  REFERRING PROVIDER: Dr Everrett Coombe  REFERRING DIAG: Cervical spinal stenosis; Lt shoulder pain/weakness   THERAPY DIAG:  Cervical dysfunction  Other symptoms and signs involving the musculoskeletal system  Abnormal posture  Muscle weakness (generalized)  Rationale for Evaluation and Treatment: Rehabilitation  ONSET DATE: 05/25/22  SUBJECTIVE:                                                                                                                                                                                                         SUBJECTIVE STATEMENT: Patient reports that he was off from work yesterday and will be off today. He continues to have tingling in the Lt hand. Has done the exercises some at home. Notices that he has weakness in the Lt  shoulder and arm with functional activitie. Symptoms are still worse toward the end of the day. He will schedule to see a neurosurgeon.   PERTINENT HISTORY:  HTN; chronic LBP and intermittent cervical pain and radicular symptoms   PAIN:  Are you having pain? Yes: NPRS scale: 2-3/10; can get up to 5-6 end of day Pain location: neck and Lt UE in shoulder area into the back of the neck  Pain description: nagging; sharp with activity Aggravating factors: reaching out to side; lifting; reaching up; weight  bearing UE's  Relieving factors: lying on Lt side; meds  PRECAUTIONS: None  WEIGHT BEARING RESTRICTIONS: No  FALLS:  Has patient fallen in last 6 months? 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  Does some household chores; computer; sitting in soft couch when relaxing  PATIENT GOALS: get strength back in the Lt shoulder; postpone surgery as long as possible   NEXT MD VISIT: to schedule   OBJECTIVE:   DIAGNOSTIC FINDINGS:  MRI showed severe left neural foraminal narrowing at C4-C5 and C5-C6 along with severe neural foraminal narrowing 2/2 to large left subarticular disc protrusion at C7-T1  SENSATION: Intermittent tingling and numbness in the Lt UE - mostly pain radiating into the Lt at times to finger tips    PALPATION: Muscular tightness Lt > Rt ant/lat/post cervical musculature; pecs; upper trap. Tenderness to PA mobs through the mid thoracic spine    CERVICAL ROM:  Symptoms on Lt  - sore and tight throughout  Active ROM A/PROM (deg) eval  Flexion 44  Extension  50  Right lateral flexion 36 sore  Left lateral flexion 36 sore   Right rotation 64  Left rotation 64   (Blank rows = not tested)  UPPER EXTREMITY ROM:  Active ROM Right eval Left eval  Shoulder flexion 147 117 tight/ painful   Shoulder extension 65 41 sore  Shoulder abduction 163 115 tight/ painful  Shoulder  adduction    Shoulder extension    Shoulder internal rotation    Shoulder external rotation 80 shoulder 90 deg/elbow 90 deg 60 shoulder 90/elbow 90 deg - tight; painful  Elbow flexion    Elbow extension    Wrist flexion    Wrist extension    Wrist ulnar deviation    Wrist radial deviation    Wrist pronation    Wrist supination     (Blank rows = not tested)  UPPER EXTREMITY MMT:  MMT Right eval Left eval  Shoulder flexion 5/5 3+/5  Shoulder extension    Shoulder abduction 5/5 3-/5  Shoulder adduction    Shoulder extension    Shoulder internal rotation 5/5 4/5  Shoulder external rotation 5/5  4-/5  Middle trapezius    Lower trapezius    Elbow flexion    Elbow extension    Wrist flexion 5/5 5/5  Wrist extension 5/5 5-/5  Wrist ulnar deviation    Wrist radial deviation    Wrist pronation    Wrist supination    Grip strength     (Blank rows = not tested)  CERVICAL SPECIAL TESTS:  Compression/distraction - negative   GRIP STRENGTH: 06/26/22: Rt 55 #; Lt 42 #  [facility] Adult PT Treatment:                                                 Date: 06/29/22: Therapeutic Exercise: UBE L4; 2 min fwd, 2 min bwd Standing doorway pec stretch low, mid, high 2x30 sec each Standing against pool noodle Cervical retraction x10 Scap retraction x10 "W" 2x10 Row isometric green TB 5 sec hold x 10  Shoulder extension green TB x 10  Scap retraction + shoulder ER 2x10 (Trial of ER isometric increased Lt UE pain)  Sitting -thoracic extensions in chair coregeous ball x10 Supine - axial  extension 3 sec x 10   Manual Therapy:  STM through the ant/lat/post cervical musculature; pecs; upper trap; biceps/anterior shoulder pt supine  Modalities: TENS Lt cervical and upper trap x 15 min Moist heat cervical and Lt shoulder   Date 06/26/22:  Therapeutic Exercise: UBE L4; 2 min fwd, 2 min bwd Standing doorway pec stretch low, mid, high 2x30 sec each Standing against pool noodle Cervical  retraction x10 Scap retraction x10 Scap retraction + shoulder ER 2x10 "W" 2x10 ER red TB increased pain Lt shoulder  Sitting Thoracic extensions in chair coregeous ball x10  Manual Therapy:  STM through the ant/lat/post cervical musculature; pecs; upper trap; biceps/anterior shoulder pt supine  Modalities: TENS Lt cervical and upper trap x 15 min Moist heat cervical and Lt shoulder    PATIENT EDUCATION:  Education details: POC; HEP  Person educated: Patient Education method: Programmer, multimediaxplanation, Demonstration, ActorTactile cues, Verbal cues, and Handouts Education comprehension: verbalized understanding, returned demonstration, verbal cues required, tactile cues required, and needs further education  HOME EXERCISE PROGRAM: Access Code: [email or web address] URL: [email or web address] Date: 06/29/2022 Prepared by: Corlis Leakelyn Jolyssa Oplinger  Exercises - Seated Cervical Retraction  - 3 x daily - 7 x weekly - 1 sets - 10 reps - Standing Scapular Retraction  - 3 x daily - 7 x weekly - 1 sets - 10 reps - 10 hold - Shoulder External Rotation and Scapular Retraction  - 3 x daily - 7 x weekly - 1 sets - 10 reps -   hold - Doorway Pec Stretch at 60 Degrees Abduction  - 3 x daily - 7 x weekly - 1 sets - 3 reps - Doorway Pec Stretch at 90 Degrees Abduction  - 3 x daily - 7 x weekly - 1 sets - 3 reps - 30 seconds  hold - Doorway Pec Stretch at 120 Degrees Abduction  - 3 x daily - 7 x weekly - 1 sets - 3 reps - 30 second hold  hold - Standing Pectoral Release with Shoulder Abduction with Ball at Wall  - 2-3 x daily - 7 x weekly - Sidelying Thoracic Rotation with Open Book  - 1 x daily - 7 x weekly - 1 sets - 10 reps - 3 sec hold - Seated Thoracic Lumbar Extension with Pectoralis Stretch  - 1 x daily - 7 x weekly - 1 sets - 10 reps - 3 sec hold - Seated Cervical Sidebending AROM  - 2 x daily - 7 x weekly - 1 sets - 5 reps - 5-10 sec  hold - Standing Shoulder Row Reactive Isometric  - 2 x daily - 7 x weekly - 1 sets -  10 reps - 30-45 sec  hold - Shoulder extension with resistance - Neutral  - 1 x daily - 7 x weekly - 1-2 sets - 10 reps - 3-5 sec  hold EDUCATION:  Dry needling  TENS   ASSESSMENT:  CLINICAL IMPRESSION: Continued pain and radicular symptoms. Focus of treatment to address posture and alignment, improve tissue extensibility and increased strength. Tried to add posterior shoulder girdle strengthening which was not well tolerated by patient. He reported increased radicular symptoms. Reviewed and progressed HEP as tolerated. continued with trial of manual work to address thoracic hypomobility and trigger point release of muscular tightness focus through the Lt cervical musculature. Worked on postural stability, thoracic mobility, posterior shoulder girdle strengthening. Repeated trial of TENS unit for pain management for patient to assess possibility of ordering TENS for home.   OBJECTIVE  IMPAIRMENTS: decreased activity tolerance, decreased mobility, decreased ROM, decreased strength, increased fascial restrictions, increased muscle spasms, impaired flexibility, impaired sensation, impaired UE functional use, improper body mechanics, postural dysfunction, and pain.    GOALS: Goals reviewed with patient? Yes  SHORT TERM GOALS: Target date: 07/17/2022   Independent in initial HEP  Baseline: no exercises Goal status: INITIAL  2.  Verbalizes and/or demonstrates proper sitting w/ assist as needed  Baseline: poor posture in sitting  Goal status: INITIAL  LONG TERM GOALS: Target date: 08/13/2022   Improve posture and alignment with patient to demonstrated improved alignment with posterior shoulder girdle engaged Baseline: see eval Goal status: INITIAL  2.  Increase AROM Lt shoulder to within 5 degrees of AROM Rt shoulder with minimal to no pain Baseline:  Goal status: INITIAL  3.  Increased AROM cervical spine in all planes by 3-5 degrees with minimal to no pain  Baseline:  Goal status:  INITIAL  4.  Increase strength Lt UE to =/> 4/5 in all planes  Baseline: see eval Goal status: INITIAL  5.  Independent in HEP Baseline:  Goal status: INITIAL   PLAN:  PT FREQUENCY: 2x/week  PT DURATION: 8 weeks  PLANNED INTERVENTIONS: Therapeutic exercises, Therapeutic activity, Neuromuscular re-education, Patient/Family education, Self Care, Joint mobilization, Aquatic Therapy, Dry Needling, Spinal mobilization, Cryotherapy, Moist heat, Taping, Traction, Ultrasound, Ionotophoresis 4mg /ml Dexamethasone, Manual therapy, and Re-evaluation  PLAN FOR NEXT SESSION: review and progress HEP; continue with postural education/correction; manual work and/or dry needling as indicated; modalities as indicated; assess response to TENS for pain management   Graciano Batson , PT, MPH 06/29/2022, 9:31 AM

## 2022-07-02 ENCOUNTER — Ambulatory Visit: Payer: BC Managed Care – PPO | Admitting: Rehabilitative and Restorative Service Providers"

## 2022-07-02 ENCOUNTER — Encounter: Payer: Self-pay | Admitting: Rehabilitative and Restorative Service Providers"

## 2022-07-02 DIAGNOSIS — R29898 Other symptoms and signs involving the musculoskeletal system: Secondary | ICD-10-CM

## 2022-07-02 DIAGNOSIS — M539 Dorsopathy, unspecified: Secondary | ICD-10-CM

## 2022-07-02 DIAGNOSIS — M6281 Muscle weakness (generalized): Secondary | ICD-10-CM

## 2022-07-02 DIAGNOSIS — R293 Abnormal posture: Secondary | ICD-10-CM

## 2022-07-02 NOTE — Therapy (Signed)
OUTPATIENT PHYSICAL THERAPY CERVICAL EVALUATION   Patient Name: James Mayer MRN: [id number] DOB:November 11, 1980, 41 y.o., male Today's Date: 07/02/2022  END OF SESSION:  PT End of Session - 07/02/22 0932     Visit Number 5    Number of Visits 16    Date for PT Re-Evaluation 08/13/22    PT Start Time 0931    PT Stop Time 1015    PT Time Calculation (min) 44 min    Activity Tolerance Patient tolerated treatment well             Past Medical History:  Diagnosis Date   Hypertension    Low back pain    History reviewed. No pertinent surgical history. Patient Active Problem List   Diagnosis Date Noted   Cervical spinal stenosis 06/11/2022   Serous otitis media 09/27/2021   Tinnitus, bilateral 02/06/2021   Anxiety disorder, unspecified 02/06/2021   Conduct disorder, unspecified 02/06/2021   Restless legs 01/15/2021   Neck pain 08/22/2020   Idiopathic peripheral neuropathy 08/22/2020   Cervical spondylosis 08/22/2020   Bronchitis 08/22/2020   Acute low back pain 01/27/2020   Acute left ankle pain 08/28/2019   Polydipsia 07/31/2019   Arthralgia 04/14/2019   Essential hypertension 08/26/2018   Chronic low back pain 08/26/2018    PCP: Dr Everrett Coombe  REFERRING PROVIDER: Dr Everrett Coombe  REFERRING DIAG: Cervical spinal stenosis; Lt shoulder pain/weakness   THERAPY DIAG:  Cervical dysfunction  Other symptoms and signs involving the musculoskeletal system  Abnormal posture  Muscle weakness (generalized)  Rationale for Evaluation and Treatment: Rehabilitation  ONSET DATE: 05/25/22  SUBJECTIVE:                                                                                                                                                                                                         SUBJECTIVE STATEMENT: Patient reports that he had a bad day yesterday. He worked the last two days but that is not unusual. He feels the increase in symptoms may be  related to the weather.  He has had weather related issues with his back for years. He has not had tingling in the Lt hand in the past few days. Has done the exercises some at home. He continues to notice weakness in the Lt shoulder and arm with functional activitie. Symptoms are still worse toward the end of the day. He will schedule to see a neurosurgeon but [facility].  PERTINENT HISTORY:  HTN; chronic LBP and intermittent cervical pain and  radicular symptoms   PAIN:  Are you having pain? Yes: NPRS scale: 2/10; can get up to 5-6 end of day Pain location: neck and Lt UE in shoulder area into the back of the neck  Pain description: nagging; sharp with activity Aggravating factors: reaching out to side; lifting; reaching up; weight bearing UE's  Relieving factors: lying on Lt side; meds  PRECAUTIONS: None  WEIGHT BEARING RESTRICTIONS: No  FALLS:  Has patient fallen in last 6 months? 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  Does some household chores; computer; sitting in soft couch when relaxing  PATIENT GOALS: get strength back in the Lt shoulder; postpone surgery as long as possible   NEXT MD VISIT: to schedule   OBJECTIVE:   DIAGNOSTIC FINDINGS:  MRI showed severe left neural foraminal narrowing at C4-C5 and C5-C6 along with severe neural foraminal narrowing 2/2 to large left subarticular disc protrusion at C7-T1  SENSATION: Intermittent tingling and numbness in the Lt UE - mostly pain radiating into the Lt at times to finger tips    PALPATION: Muscular tightness Lt > Rt ant/lat/post cervical musculature; pecs; upper trap. Tenderness to PA mobs through the mid thoracic spine    CERVICAL ROM:  Symptoms on Lt  - sore and tight throughout  Active ROM A/PROM (deg) eval  Flexion 44  Extension  50  Right lateral flexion 36 sore  Left lateral flexion 36  sore   Right rotation 64  Left rotation 64   (Blank rows = not tested)  UPPER EXTREMITY ROM:  Active ROM Right eval Left eval  Shoulder flexion 147 117 tight/ painful   Shoulder extension 65 41 sore  Shoulder abduction 163 115 tight/ painful  Shoulder adduction    Shoulder extension    Shoulder internal rotation    Shoulder external rotation 80 shoulder 90 deg/elbow 90 deg 60 shoulder 90/elbow 90 deg - tight; painful  Elbow flexion    Elbow extension    Wrist flexion    Wrist extension    Wrist ulnar deviation    Wrist radial deviation    Wrist pronation    Wrist supination     (Blank rows = not tested)  UPPER EXTREMITY MMT:  MMT Right eval Left eval  Shoulder flexion 5/5 3+/5  Shoulder extension    Shoulder abduction 5/5 3-/5  Shoulder adduction    Shoulder extension    Shoulder internal rotation 5/5 4/5  Shoulder external rotation 5/5  4-/5  Middle trapezius    Lower trapezius    Elbow flexion    Elbow extension    Wrist flexion 5/5 5/5  Wrist extension 5/5 5-/5  Wrist ulnar deviation    Wrist radial deviation    Wrist pronation    Wrist supination    Grip strength     (Blank rows = not tested)  CERVICAL SPECIAL TESTS:  Compression/distraction - negative   GRIP STRENGTH: 06/26/22: Rt 55 #; Lt 42 #  [facility] Adult PT Treatment:                                                 Date: 07/02/22  Therapeutic Exercise: UBE L4; 2 min fwd, 2 min bwd Standing doorway pec stretch low, mid, high 2x30 sec each  Standing against pool noodle Cervical retraction x10 Scap retraction x10 "W" 2x10 Row isometric green TB 5 sec hold x 10  Shoulder extension green TB x 10  Scap retraction + shoulder ER 2x10 Sitting:   Thoracic extensions in chair/coregeous ball x10  Manual Therapy:  STM through the ant/lat/post cervical musculature; pecs; upper trap; biceps/anterior shoulder pt supine  Modalities: Cervical traction x 15 min; 18# max, 13# release; on 60 sec, off 10  sec; progressive up/down 2 steps moist heat with traction TENS Lt cervical and upper trap x 15 min   Date: 06/29/22: Therapeutic Exercise: UBE L4; 2 min fwd, 2 min bwd Standing doorway pec stretch low, mid, high 2x30 sec each Standing against pool noodle Cervical retraction x10 Scap retraction x10 "W" 2x10 Row isometric green TB 5 sec hold x 10  Shoulder extension green TB x 10  Scap retraction + shoulder ER 2x10 (Trial of ER isometric increased Lt UE pain)  Sitting -thoracic extensions in chair coregeous ball x10 Supine - axial extension 3 sec x 10   Manual Therapy:  STM through the ant/lat/post cervical musculature; pecs; upper trap; biceps/anterior shoulder pt supine  Modalities: Moist heat cervical and Lt shoulder w/cervical traction   PATIENT EDUCATION:  Education details: POC; HEP  Person educated: Patient Education method: Programmer, multimedia, Demonstration, Tactile cues, Verbal cues, and Handouts Education comprehension: verbalized understanding, returned demonstration, verbal cues required, tactile cues required, and needs further education  HOME EXERCISE PROGRAM: Access Code: [email or web address] URL: [email or web address] Date: 06/29/2022 Prepared by: Corlis Leak  Exercises - Seated Cervical Retraction  - 3 x daily - 7 x weekly - 1 sets - 10 reps - Standing Scapular Retraction  - 3 x daily - 7 x weekly - 1 sets - 10 reps - 10 hold - Shoulder External Rotation and Scapular Retraction  - 3 x daily - 7 x weekly - 1 sets - 10 reps -   hold - Doorway Pec Stretch at 60 Degrees Abduction  - 3 x daily - 7 x weekly - 1 sets - 3 reps - Doorway Pec Stretch at 90 Degrees Abduction  - 3 x daily - 7 x weekly - 1 sets - 3 reps - 30 seconds  hold - Doorway Pec Stretch at 120 Degrees Abduction  - 3 x daily - 7 x weekly - 1 sets - 3 reps - 30 second hold  hold - Standing Pectoral Release with Shoulder Abduction with Ball at Wall  - 2-3 x daily - 7 x weekly - Sidelying Thoracic  Rotation with Open Book  - 1 x daily - 7 x weekly - 1 sets - 10 reps - 3 sec hold - Seated Thoracic Lumbar Extension with Pectoralis Stretch  - 1 x daily - 7 x weekly - 1 sets - 10 reps - 3 sec hold - Seated Cervical Sidebending AROM  - 2 x daily - 7 x weekly - 1 sets - 5 reps - 5-10 sec  hold - Standing Shoulder Row Reactive Isometric  - 2 x daily - 7 x weekly - 1 sets - 10 reps - 30-45 sec  hold - Shoulder extension with resistance - Neutral  - 1 x daily - 7 x weekly - 1-2 sets - 10 reps - 3-5 sec  hold EDUCATION:  Dry needling  TENS   ASSESSMENT:  CLINICAL IMPRESSION: Continued pain and radicular symptoms with variable intensity. Working on posture and alignment, improving tissue extensibility and increasing strength as patient tolerates. Patient has had  difficulty progressing strengthening exercises due to pain with exercise.Trial of cervical traction to provide sustained stretch through the cervical musculature which was tolerated well.   OBJECTIVE IMPAIRMENTS: decreased activity tolerance, decreased mobility, decreased ROM, decreased strength, increased fascial restrictions, increased muscle spasms, impaired flexibility, impaired sensation, impaired UE functional use, improper body mechanics, postural dysfunction, and pain.    GOALS: Goals reviewed with patient? Yes  SHORT TERM GOALS: Target date: 07/17/2022   Independent in initial HEP  Baseline: no exercises Goal status: INITIAL  2.  Verbalizes and/or demonstrates proper sitting w/ assist as needed  Baseline: poor posture in sitting  Goal status: INITIAL  LONG TERM GOALS: Target date: 08/13/2022   Improve posture and alignment with patient to demonstrated improved alignment with posterior shoulder girdle engaged Baseline: see eval Goal status: INITIAL  2.  Increase AROM Lt shoulder to within 5 degrees of AROM Rt shoulder with minimal to no pain Baseline:  Goal status: INITIAL  3.  Increased AROM cervical spine in all  planes by 3-5 degrees with minimal to no pain  Baseline:  Goal status: INITIAL  4.  Increase strength Lt UE to =/> 4/5 in all planes  Baseline: see eval Goal status: INITIAL  5.  Independent in HEP Baseline:  Goal status: INITIAL   PLAN:  PT FREQUENCY: 2x/week  PT DURATION: 8 weeks  PLANNED INTERVENTIONS: Therapeutic exercises, Therapeutic activity, Neuromuscular re-education, Patient/Family education, Self Care, Joint mobilization, Aquatic Therapy, Dry Needling, Spinal mobilization, Cryotherapy, Moist heat, Taping, Traction, Ultrasound, Ionotophoresis 4mg /ml Dexamethasone, Manual therapy, and Re-evaluation  PLAN FOR NEXT SESSION: review and progress HEP; continue with postural education/correction; manual work and/or dry needling as indicated; modalities as indicated; assess response to TENS for pain management and cervical traction   Katriel Cutsforth Nilda Simmer, PT, MPH 07/02/2022, 9:32 AM

## 2022-07-04 ENCOUNTER — Ambulatory Visit: Payer: BC Managed Care – PPO | Admitting: Rehabilitative and Restorative Service Providers"

## 2022-07-05 ENCOUNTER — Other Ambulatory Visit: Payer: Self-pay | Admitting: Family Medicine

## 2022-07-10 ENCOUNTER — Encounter: Payer: Self-pay | Admitting: Physical Therapy

## 2022-07-10 ENCOUNTER — Ambulatory Visit: Payer: BC Managed Care – PPO | Admitting: Physical Therapy

## 2022-07-10 DIAGNOSIS — R29898 Other symptoms and signs involving the musculoskeletal system: Secondary | ICD-10-CM

## 2022-07-10 DIAGNOSIS — R293 Abnormal posture: Secondary | ICD-10-CM

## 2022-07-10 DIAGNOSIS — M6281 Muscle weakness (generalized): Secondary | ICD-10-CM | POA: Diagnosis not present

## 2022-07-10 DIAGNOSIS — M539 Dorsopathy, unspecified: Secondary | ICD-10-CM

## 2022-07-10 NOTE — Therapy (Signed)
OUTPATIENT PHYSICAL THERAPY TREATMENT   Patient Name: James Mayer MRN: [id number] DOB:09/21/1980, 41 y.o., male Today's Date: 07/10/2022  END OF SESSION:  PT End of Session - 07/10/22 0803     Visit Number 6    Number of Visits 16    Date for PT Re-Evaluation 08/13/22    PT Start Time 0804    PT Stop Time 0845    PT Time Calculation (min) 41 min    Activity Tolerance Patient tolerated treatment well              Past Medical History:  Diagnosis Date   Hypertension    Low back pain    History reviewed. No pertinent surgical history. Patient Active Problem List   Diagnosis Date Noted   Cervical spinal stenosis 06/11/2022   Serous otitis media 09/27/2021   Tinnitus, bilateral 02/06/2021   Anxiety disorder, unspecified 02/06/2021   Conduct disorder, unspecified 02/06/2021   Restless legs 01/15/2021   Neck pain 08/22/2020   Idiopathic peripheral neuropathy 08/22/2020   Cervical spondylosis 08/22/2020   Bronchitis 08/22/2020   Acute low back pain 01/27/2020   Acute left ankle pain 08/28/2019   Polydipsia 07/31/2019   Arthralgia 04/14/2019   Essential hypertension 08/26/2018   Chronic low back pain 08/26/2018    PCP: Dr Everrett Coombe  REFERRING PROVIDER: Dr Everrett Coombe  REFERRING DIAG: Cervical spinal stenosis; Lt shoulder pain/weakness   THERAPY DIAG:  Cervical dysfunction  Other symptoms and signs involving the musculoskeletal system  Abnormal posture  Muscle weakness (generalized)  Rationale for Evaluation and Treatment: Rehabilitation  ONSET DATE: 05/25/22  SUBJECTIVE:                                                                                                                                                                                                         SUBJECTIVE STATEMENT: Pt reports feeling tired and sore. Neck and shoulder are okay this morning.   PERTINENT HISTORY:  HTN; chronic LBP and intermittent cervical pain and  radicular symptoms   PAIN:  Are you having pain? Yes: NPRS scale: 2/10; can get up to 5-6 end of day Pain location: neck and Lt UE in shoulder area into the back of the neck  Pain description: nagging; sharp with activity Aggravating factors: reaching out to side; lifting; reaching up; weight bearing UE's  Relieving factors: lying on Lt side; meds  PRECAUTIONS: None  WEIGHT BEARING RESTRICTIONS: No  FALLS:  Has patient fallen in last 6 months? 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  Does some household chores; computer; sitting in soft couch when relaxing  PATIENT GOALS: get strength back in the Lt shoulder; postpone surgery as long as possible   NEXT MD VISIT: Jan 16 with neurosurgeon, Next week will see rheumatologist  OBJECTIVE:  From eval unless otherwise noted  DIAGNOSTIC FINDINGS:  MRI showed severe left neural foraminal narrowing at C4-C5 and C5-C6 along with severe neural foraminal narrowing 2/2 to large left subarticular disc protrusion at C7-T1  SENSATION: Intermittent tingling and numbness in the Lt UE - mostly pain radiating into the Lt at times to finger tips    PALPATION: Muscular tightness Lt > Rt ant/lat/post cervical musculature; pecs; upper trap. Tenderness to PA mobs through the mid thoracic spine    CERVICAL ROM:  Symptoms on Lt  - sore and tight throughout  Active ROM A/PROM (deg) eval  Flexion 44  Extension  50  Right lateral flexion 36 sore  Left lateral flexion 36 sore   Right rotation 64  Left rotation 64   (Blank rows = not tested)  UPPER EXTREMITY ROM:  Active ROM Right eval Left eval  Shoulder flexion 147 117 tight/ painful   Shoulder extension 65 41 sore  Shoulder abduction 163 115 tight/ painful  Shoulder adduction    Shoulder extension    Shoulder internal rotation    Shoulder external rotation 80 shoulder 90 deg/elbow 90 deg 60 shoulder  90/elbow 90 deg - tight; painful  Elbow flexion    Elbow extension    Wrist flexion    Wrist extension    Wrist ulnar deviation    Wrist radial deviation    Wrist pronation    Wrist supination     (Blank rows = not tested)  UPPER EXTREMITY MMT:  MMT Right eval Left eval  Shoulder flexion 5/5 3+/5  Shoulder extension    Shoulder abduction 5/5 3-/5  Shoulder adduction    Shoulder extension    Shoulder internal rotation 5/5 4/5  Shoulder external rotation 5/5  4-/5  Middle trapezius    Lower trapezius    Elbow flexion    Elbow extension    Wrist flexion 5/5 5/5  Wrist extension 5/5 5-/5  Wrist ulnar deviation    Wrist radial deviation    Wrist pronation    Wrist supination    Grip strength     (Blank rows = not tested)  CERVICAL SPECIAL TESTS:  Compression/distraction - negative   GRIP STRENGTH: 06/26/22: Rt 55 #; Lt 42 #  [facility] Adult PT Treatment:                                                DATE: 07/10/22 Therapeutic Exercise: UBE L5; 2 min fwd, 2 min bwd Standing doorway pec stretch low, mid, high 2x30 sec each Supine AAROM shoulder abd, flex, ER x10 each with cane Supine shoulder abd yellow TB up to 90 deg x10 Supine shoulder flex yellow TB x10 Supine shoulder ER yellow TB x10 Supine "W" 2x10 Supine cervical retraction Shoulder extension green TB x 10  Scap retraction + shoulder ER 2x10 Manual Therapy: STM through pecs; upper trap; infraspinatus Modalities: Cervical traction x 15 min; 18# max, 13# release; on 60 sec, off 10 sec; progressive up/down 2 steps moist heat with traction  [facility] Adult PT Treatment:                                                Date: 07/02/22  Therapeutic Exercise: UBE L4; 2 min fwd, 2 min bwd Standing doorway pec stretch low, mid, high 2x30 sec each Standing against pool noodle Cervical retraction x10 Scap retraction x10 "W" 2x10 Row isometric green TB 5 sec hold x 10  Shoulder extension green TB x 10  Scap  retraction + shoulder ER 2x10 Sitting:   Thoracic extensions in chair/coregeous ball x10  Manual Therapy:  STM through the ant/lat/post cervical musculature; pecs; upper trap; biceps/anterior shoulder pt supine  Modalities: Cervical traction x 15 min; 18# max, 13# release; on 60 sec, off 10 sec; progressive up/down 2 steps moist heat with traction TENS Lt cervical and upper trap x 15 min   PATIENT EDUCATION:  Education details: POC; HEP  Person educated: Patient Education method: Programmer, multimedia, Demonstration, Actor cues, Verbal cues, and Handouts Education comprehension: verbalized understanding, returned demonstration, verbal cues required, tactile cues required, and needs further education  HOME EXERCISE PROGRAM: Access Code: [email or web address] URL: [email or web address] Date: 06/29/2022 Prepared by: Corlis Leak  Exercises - Seated Cervical Retraction  - 3 x daily - 7 x weekly - 1 sets - 10 reps - Standing Scapular Retraction  - 3 x daily - 7 x weekly - 1 sets - 10 reps - 10 hold - Shoulder External Rotation and Scapular Retraction  - 3 x daily - 7 x weekly - 1 sets - 10 reps -   hold - Doorway Pec Stretch at 60 Degrees Abduction  - 3 x daily - 7 x weekly - 1 sets - 3 reps - Doorway Pec Stretch at 90 Degrees Abduction  - 3 x daily - 7 x weekly - 1 sets - 3 reps - 30 seconds  hold - Doorway Pec Stretch at 120 Degrees Abduction  - 3 x daily - 7 x weekly - 1 sets - 3 reps - 30 second hold  hold - Standing Pectoral Release with Shoulder Abduction with Ball at Wall  - 2-3 x daily - 7 x weekly - Sidelying Thoracic Rotation with Open Book  - 1 x daily - 7 x weekly - 1 sets - 10 reps - 3 sec hold - Seated Thoracic Lumbar Extension with Pectoralis Stretch  - 1 x daily - 7 x weekly - 1 sets - 10 reps - 3 sec hold - Seated Cervical Sidebending AROM  - 2 x daily - 7 x weekly - 1 sets - 5 reps - 5-10 sec  hold - Standing Shoulder Row Reactive Isometric  - 2 x daily - 7 x weekly - 1 sets -  10 reps - 30-45 sec  hold - Shoulder extension with resistance - Neutral  - 1 x daily - 7 x weekly - 1-2 sets - 10 reps - 3-5 sec  hold EDUCATION:  Dry needling  TENS   ASSESSMENT:  CLINICAL IMPRESSION: Pt states he felt that traction was helpful on last visit. Continued traction this session. Session focused on progressing shoulder strengthening as tolerated in gravity eliminated (pt too weak against gravity). Trigger points most notable in pec minor and infraspinatus this session.   OBJECTIVE IMPAIRMENTS: decreased activity tolerance, decreased mobility, decreased ROM, decreased strength, increased fascial restrictions, increased muscle spasms, impaired flexibility,  impaired sensation, impaired UE functional use, improper body mechanics, postural dysfunction, and pain.    GOALS: Goals reviewed with patient? Yes  SHORT TERM GOALS: Target date: 07/17/2022   Independent in initial HEP  Baseline: no exercises Goal status: INITIAL  2.  Verbalizes and/or demonstrates proper sitting w/ assist as needed  Baseline: poor posture in sitting  Goal status: INITIAL  LONG TERM GOALS: Target date: 08/13/2022   Improve posture and alignment with patient to demonstrated improved alignment with posterior shoulder girdle engaged Baseline: see eval Goal status: INITIAL  2.  Increase AROM Lt shoulder to within 5 degrees of AROM Rt shoulder with minimal to no pain Baseline:  Goal status: INITIAL  3.  Increased AROM cervical spine in all planes by 3-5 degrees with minimal to no pain  Baseline:  Goal status: INITIAL  4.  Increase strength Lt UE to =/> 4/5 in all planes  Baseline: see eval Goal status: INITIAL  5.  Independent in HEP Baseline:  Goal status: INITIAL   PLAN:  PT FREQUENCY: 2x/week  PT DURATION: 8 weeks  PLANNED INTERVENTIONS: Therapeutic exercises, Therapeutic activity, Neuromuscular re-education, Patient/Family education, Self Care, Joint mobilization, Aquatic Therapy,  Dry Needling, Spinal mobilization, Cryotherapy, Moist heat, Taping, Traction, Ultrasound, Ionotophoresis 4mg /ml Dexamethasone, Manual therapy, and Re-evaluation  PLAN FOR NEXT SESSION: review and progress HEP; continue with postural education/correction; manual work and/or dry needling as indicated; modalities as indicated; assess response to TENS for pain management and cervical traction   Nadirah Socorro April Ma L Labron Bloodgood, PT, DPT 07/10/2022, 8:04 AM

## 2022-07-11 ENCOUNTER — Encounter: Payer: No Typology Code available for payment source | Admitting: Internal Medicine

## 2022-07-12 ENCOUNTER — Encounter: Payer: No Typology Code available for payment source | Admitting: Physical Therapy

## 2022-07-12 ENCOUNTER — Ambulatory Visit: Payer: BC Managed Care – PPO

## 2022-07-12 DIAGNOSIS — M539 Dorsopathy, unspecified: Secondary | ICD-10-CM

## 2022-07-12 DIAGNOSIS — M6281 Muscle weakness (generalized): Secondary | ICD-10-CM

## 2022-07-12 DIAGNOSIS — R293 Abnormal posture: Secondary | ICD-10-CM

## 2022-07-12 DIAGNOSIS — R29898 Other symptoms and signs involving the musculoskeletal system: Secondary | ICD-10-CM

## 2022-07-12 NOTE — Therapy (Signed)
OUTPATIENT PHYSICAL THERAPY TREATMENT   Patient Name: James BleakJason D Gauntt MRN: [id number] DOB:1981-01-09, 41 y.o., male Today's Date: 07/12/2022  END OF SESSION:  PT End of Session - 07/12/22 0849     Visit Number 7    Number of Visits 16    Date for PT Re-Evaluation 08/13/22    PT Start Time 0850    PT Stop Time 0928    PT Time Calculation (min) 38 min    Activity Tolerance Patient tolerated treatment well    [facility] for tasks assessed/performed              Past Medical History:  Diagnosis Date   Hypertension    Low back pain    History reviewed. No pertinent surgical history. Patient Active Problem List   Diagnosis Date Noted   Cervical spinal stenosis 06/11/2022   Serous otitis media 09/27/2021   Tinnitus, bilateral 02/06/2021   Anxiety disorder, unspecified 02/06/2021   Conduct disorder, unspecified 02/06/2021   Restless legs 01/15/2021   Neck pain 08/22/2020   Idiopathic peripheral neuropathy 08/22/2020   Cervical spondylosis 08/22/2020   Bronchitis 08/22/2020   Acute low back pain 01/27/2020   Acute left ankle pain 08/28/2019   Polydipsia 07/31/2019   Arthralgia 04/14/2019   Essential hypertension 08/26/2018   Chronic low back pain 08/26/2018    PCP: Dr Everrett Coombeody Matthews  REFERRING PROVIDER: Dr Everrett Coombeody Matthews  REFERRING DIAG: Cervical spinal stenosis; Lt shoulder pain/weakness   THERAPY DIAG:  Cervical dysfunction  Other symptoms and signs involving the musculoskeletal system  Abnormal posture  Muscle weakness (generalized)  Rationale for Evaluation and Treatment: Rehabilitation  ONSET DATE: 05/25/22  SUBJECTIVE:                                                                                                                                                                                                         SUBJECTIVE STATEMENT: Patient reports he feels sore "all over" but neck and shoulder feel fine. Patient states he  continues to need assistance from R arm to pull a door closed or lift things with L arm. Patient states he had some cervical discomfort/pain later in the evening after traction treatment at previous session.  PERTINENT HISTORY:  HTN; chronic LBP and intermittent cervical pain and radicular symptoms   PAIN:  Are you having pain? Yes: NPRS scale: 2/10; can get up to 5-6 end of day Pain location: neck and Lt UE in shoulder area into the back of the neck  Pain description: nagging; sharp with activity Aggravating factors:  reaching out to side; lifting; reaching up; weight bearing UE's  Relieving factors: lying on Lt side; meds  PRECAUTIONS: None  WEIGHT BEARING RESTRICTIONS: No  FALLS:  Has patient fallen in last 6 months? 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  Does some household chores; computer; sitting in soft couch when relaxing  PATIENT GOALS: get strength back in the Lt shoulder; postpone surgery as long as possible   NEXT MD VISIT: Jan 16 with neurosurgeon, Next week will see rheumatologist  OBJECTIVE:  From eval unless otherwise noted  DIAGNOSTIC FINDINGS:  MRI showed severe left neural foraminal narrowing at C4-C5 and C5-C6 along with severe neural foraminal narrowing 2/2 to large left subarticular disc protrusion at C7-T1  SENSATION: Intermittent tingling and numbness in the Lt UE - mostly pain radiating into the Lt at times to finger tips    PALPATION: Muscular tightness Lt > Rt ant/lat/post cervical musculature; pecs; upper trap. Tenderness to PA mobs through the mid thoracic spine    CERVICAL ROM:  Symptoms on Lt  - sore and tight throughout  Active ROM A/PROM (deg) eval  Flexion 44  Extension  50  Right lateral flexion 36 sore  Left lateral flexion 36 sore   Right rotation 64  Left rotation 64   (Blank rows = not tested)  UPPER EXTREMITY ROM:  Active ROM  Right eval Left eval  Shoulder flexion 147 117 tight/ painful   Shoulder extension 65 41 sore  Shoulder abduction 163 115 tight/ painful  Shoulder adduction    Shoulder extension    Shoulder internal rotation    Shoulder external rotation 80 shoulder 90 deg/elbow 90 deg 60 shoulder 90/elbow 90 deg - tight; painful  Elbow flexion    Elbow extension    Wrist flexion    Wrist extension    Wrist ulnar deviation    Wrist radial deviation    Wrist pronation    Wrist supination     (Blank rows = not tested)  UPPER EXTREMITY MMT:  MMT Right eval Left eval  Shoulder flexion 5/5 3+/5  Shoulder extension    Shoulder abduction 5/5 3-/5  Shoulder adduction    Shoulder extension    Shoulder internal rotation 5/5 4/5  Shoulder external rotation 5/5  4-/5  Middle trapezius    Lower trapezius    Elbow flexion    Elbow extension    Wrist flexion 5/5 5/5  Wrist extension 5/5 5-/5  Wrist ulnar deviation    Wrist radial deviation    Wrist pronation    Wrist supination    Grip strength     (Blank rows = not tested)  CERVICAL SPECIAL TESTS:  Compression/distraction - negative   GRIP STRENGTH: 06/26/22: Rt 55 #; Lt 42 #   [facility] Adult PT Treatment:                                                DATE: 07/12/2022 Therapeutic Exercise: Standing UBE L 5 fwd/bkwd 2 min each Standing doorway pec stretch low, mid, high 2x30" S/L L shoulder abd + scap MWM Seated scapular clocks Seated L shoulder ER YTB x10 Prone shoulder flex/scap x 5 each Shoulder flexion arm wall slide Manual Therapy: Supine L shoudler passive abd ROM with oscillations STM  pecs, subscap S/L scap retrac/protract MWM   [facility] Adult PT Treatment:                                                DATE: 07/10/22 Therapeutic Exercise: UBE L5; 2 min fwd, 2 min bwd Standing doorway pec stretch low, mid, high 2x30 sec each Supine AAROM shoulder abd, flex, ER x10 each with cane Supine shoulder abd yellow TB up to 90 deg  x10 Supine shoulder flex yellow TB x10 Supine shoulder ER yellow TB x10 Supine "W" 2x10 Supine cervical retraction Shoulder extension green TB x 10  Scap retraction + shoulder ER 2x10 Manual Therapy: STM through pecs; upper trap; infraspinatus Modalities: Cervical traction x 15 min; 18# max, 13# release; on 60 sec, off 10 sec; progressive up/down 2 steps moist heat with traction  PATIENT EDUCATION:  Education details: Progress HEP Person educated: Patient Education method: Explanation, Demonstration, Tactile cues, Verbal cues, and Handouts Education comprehension: verbalized understanding, returned demonstration, verbal cues required, tactile cues required, and needs further education  HOME EXERCISE PROGRAM: Access Code: [email or web address] URL: [email or web address] Date: 07/12/2022 Prepared by: Carlynn Herald  Exercises - Seated Cervical Retraction  - 3 x daily - 7 x weekly - 1 sets - 10 reps - Standing Scapular Retraction  - 3 x daily - 7 x weekly - 1 sets - 10 reps - 10 hold - Shoulder External Rotation and Scapular Retraction  - 3 x daily - 7 x weekly - 1 sets - 10 reps -   hold - Doorway Pec Stretch at 60 Degrees Abduction  - 3 x daily - 7 x weekly - 1 sets - 3 reps - Doorway Pec Stretch at 90 Degrees Abduction  - 3 x daily - 7 x weekly - 1 sets - 3 reps - 30 seconds  hold - Doorway Pec Stretch at 120 Degrees Abduction  - 3 x daily - 7 x weekly - 1 sets - 3 reps - 30 second hold  hold - Standing Pectoral Release with Shoulder Abduction with Ball at Wall  - 2-3 x daily - 7 x weekly - Sidelying Thoracic Rotation with Open Book  - 1 x daily - 7 x weekly - 1 sets - 10 reps - 3 sec hold - Seated Thoracic Lumbar Extension with Pectoralis Stretch  - 1 x daily - 7 x weekly - 1 sets - 10 reps - 3 sec hold - Seated Cervical Sidebending AROM  - 2 x daily - 7 x weekly - 1 sets - 5 reps - 5-10 sec  hold - Standing Shoulder Row Reactive Isometric  - 2 x daily - 7 x weekly - 1 sets - 10  reps - 30-45 sec  hold - Shoulder extension with resistance - Neutral  - 1 x daily - 7 x weekly - 1-2 sets - 10 reps - 3-5 sec  hold - Seated Scapular Clock (1 to 7)  - 1 x daily - 7 x weekly - 3 sets - 10 reps  ASSESSMENT:  CLINICAL IMPRESSION: Limited mobility exhibited with left scapula, most notably during shoulder flexion/abduction. Improved scapulohumeral rhythm mobility demonstrated after resisted scapular retraction/protraction, however patient unable to maintain proper body mechanics and alignment due to weakness.  OBJECTIVE IMPAIRMENTS: decreased activity tolerance, decreased mobility, decreased ROM, decreased strength, increased fascial restrictions, increased muscle spasms, impaired flexibility, impaired sensation,  impaired UE functional use, improper body mechanics, postural dysfunction, and pain.    GOALS: Goals reviewed with patient? Yes  SHORT TERM GOALS: Target date: 07/17/2022   Independent in initial HEP  Baseline: no exercises Goal status: INITIAL  2.  Verbalizes and/or demonstrates proper sitting w/ assist as needed  Baseline: poor posture in sitting  Goal status: INITIAL  LONG TERM GOALS: Target date: 08/13/2022   Improve posture and alignment with patient to demonstrated improved alignment with posterior shoulder girdle engaged Baseline: see eval Goal status: INITIAL  2.  Increase AROM Lt shoulder to within 5 degrees of AROM Rt shoulder with minimal to no pain Baseline:  Goal status: INITIAL  3.  Increased AROM cervical spine in all planes by 3-5 degrees with minimal to no pain  Baseline:  Goal status: INITIAL  4.  Increase strength Lt UE to =/> 4/5 in all planes  Baseline: see eval Goal status: INITIAL  5.  Independent in HEP Baseline:  Goal status: INITIAL   PLAN:  PT FREQUENCY: 2x/week  PT DURATION: 8 weeks  PLANNED INTERVENTIONS: Therapeutic exercises, Therapeutic activity, Neuromuscular re-education, Patient/Family education, Self Care,  Joint mobilization, Aquatic Therapy, Dry Needling, Spinal mobilization, Cryotherapy, Moist heat, Taping, Traction, Ultrasound, Ionotophoresis 4mg /ml Dexamethasone, Manual therapy, and Re-evaluation  PLAN FOR NEXT SESSION: Review Short Term Goals. Continue with postural education/correction; manual work and/or dry needling as indicated; modalities as indicated; assess response to TENS for pain management and cervical traction   , PTA 07/12/2022, 9:31 AM

## 2022-07-14 NOTE — Progress Notes (Signed)
Office Visit Note  Patient: James Mayer             Date of Birth: [birth year]           MRN: [id number]             PCP: Luetta Nutting, DO Referring: Luetta Nutting, DO Visit Date: 07/17/2022 Occupation: [employer] sales   Subjective:  New Patient (Initial Visit) (Swelling of both hands and lesions on arm, chest, and maybe back)   History of Present Illness: James Mayer is a 41 y.o. male  here for evaluation of joint pains and elevated CRP. He has ongoing neck and back pain and [facility] severe left neural foraminal narrowing at C4-C5 and C5-C6 along with severe neural foraminal narrowing 2/2 to large left subarticular disc protrusion at C7-T1. Also with bilateral hand pain and intermittent swelling and mild xray changes concerning for possible RA. Overall a lot of his joint pains have been going on for years with some gradual progression over time.  He has taken meloxicam primarily prescribed for his back with some loss of efficacy since switching to diclofenac he is seeing more improvement in symptoms.  He does definitely notice worsening of symptoms if he misses any of the twice daily dosing.  Biggest active problems right now in the neck back hands and feet.  He sometimes sees swelling of an entire finger joint at a time most recently the left third finger has been worst affected.  He has trouble tightly gripping and feels some decrease in grip strength.  He has noticed ulnar deviation and inability to fully close the second and third finger affecting both hands.  Pain is worse after heavy use or activity but also experiences pain at night and early morning if he misses any doses of the diclofenac. He experiences some skin rashes which have also been longstanding occurring on the flexor surfaces of his elbows and skin fold on the chest.  Reports dermatologist had expressed a concern for inverse pityriasis rosea but no topical treatment helped  significantly, although also does not hurt or itch.  He has some pitting affecting fingernails on both hands.   Labs reviewed ESR 22 CRP 18.0 Uric acid 4.9  04/11/22 Xray right hand IMPRESSION: 1. Mild ulnar deviation of the second and third digits at the metacarpophalangeal joints. This is nonspecific, but may be an early manifestation of inflammatory arthropathy such as rheumatoid. 2. There is trace associated spurring of the second and third metacarpal phalangeal joints.  04/11/22 Xray left hand IMPRESSION: Possible slight ulnar subluxation of the thumb proximal phalanx at the metacarpophalangeal joint, nonspecific. Trace radiocarpal, typically degenerative. No specific findings to suggest inflammatory arthropathy.  Activities of Daily Living:  Patient reports morning stiffness for a couple hours  depends if take diclofenac .   Patient Denies nocturnal pain.  Difficulty dressing/grooming: Reports Difficulty climbing stairs: Reports Difficulty getting out of chair: Reports Difficulty using hands for taps, buttons, cutlery, and/or writing: Reports  Review of Systems  Constitutional:  Positive for fatigue.  HENT:  Positive for mouth dryness. Negative for mouth sores.   Eyes:  Negative for dryness.  Respiratory:  Negative for shortness of breath.   Cardiovascular:  Negative for chest pain and palpitations.  Gastrointestinal:  Positive for constipation. Negative for blood in stool and diarrhea.  Endocrine: Positive for increased urination.  Genitourinary:  Negative for involuntary urination.  Musculoskeletal:  Positive for joint pain, joint  pain, joint swelling, muscle weakness and morning stiffness. Negative for gait problem, myalgias, muscle tenderness and myalgias.  Skin:  Positive for sensitivity to sunlight. Negative for color change and hair loss.  Allergic/Immunologic: Negative for susceptible to infections.  Neurological:  Negative for dizziness and headaches.   Hematological:  Negative for swollen glands.  Psychiatric/Behavioral:  Positive for depressed mood and sleep disturbance. The patient is nervous/anxious.     PMFS History:  Patient Active Problem List   Diagnosis Date Noted   Bilateral hand pain 07/17/2022   Cervical spinal stenosis 06/11/2022   Serous otitis media 09/27/2021   Tinnitus, bilateral 02/06/2021   Anxiety disorder, unspecified 02/06/2021   Conduct disorder, unspecified 02/06/2021   Restless legs 01/15/2021   Neck pain 08/22/2020   Idiopathic peripheral neuropathy 08/22/2020   Cervical spondylosis 08/22/2020   Bronchitis 08/22/2020   Acute low back pain 01/27/2020   Acute left ankle pain 08/28/2019   Polydipsia 07/31/2019   Arthralgia 04/14/2019   Essential hypertension 08/26/2018   Chronic low back pain 08/26/2018    Past Medical History:  Diagnosis Date   Hypertension    Low back pain     Family History  Problem Relation Age of Onset   Hypertension Father    Hyperlipidemia Father    Diabetes Sister    Diabetes Maternal Grandmother    Cancer Maternal Grandmother    Cancer Maternal Grandfather    Diabetes Paternal Grandmother    Neurologic Disorder Paternal Grandfather    No past surgical history on file. Social History   Social History Narrative   Live w wife and 2 daughter full time and 2 other children 60% of the time   Right handed   Caffeine: decaf tea and coffee   Immunization History  Administered Date(s) Administered   Influenza, Seasonal, Injecte, Preservative Fre 07/15/2015   Moderna Sars-Covid-2 Vaccination 02/01/2020, 03/01/2020   Td (Adult) 09/28/2016   Tdap 07/16/2006     Objective: Vital Signs: BP 119/83 (BP Location: Left Arm, Patient Position: Sitting, Cuff Size: Normal)   Pulse 76   Resp 18   Ht _0  (1.803 m)   Wt 233 lb (105.7 kg)   BMI 32.50 kg/m    Physical Exam Constitutional:      Appearance: He is obese.  Cardiovascular:     Rate and Rhythm: Normal rate and  regular rhythm.  Pulmonary:     Effort: Pulmonary effort is normal.     Breath sounds: Normal breath sounds.  Musculoskeletal:     Right lower leg: No edema.     Left lower leg: No edema.  Skin:    General: Skin is warm and dry.     Findings: Rash present.     Comments: Faintly erythematous skin patches in elbow flexural surfaces and along skin crease on chest, no scale no papules Fingernail pitting on both hands, toenails are long with grossly normal appearance  Neurological:     Mental Status: He is alert.  Psychiatric:        Mood and Affect: Mood normal.      Musculoskeletal Exam:  Left shoulder unable to actively abduct overhead, passive ROM normal Elbows full ROM no tenderness or swelling Wrists full ROM no tenderness or swelling 2nd digit ulnar deviation at MCP and PIP, decreased flexion ROM in right 2nd-3rd MCPs no palpable synovitis Knees full ROM no tenderness or swelling Ankles full ROM no tenderness or swelling MTPs full ROM no tenderness or swelling   Investigation: No  additional findings.  Imaging: No results found.  Recent Labs: Lab Results  Component Value Date   WBC 7.9 10/16/2019   HGB 13.2 10/16/2019   PLT 198 10/16/2019   NA 133 (L) 10/16/2019   K 3.5 10/16/2019   CL 96 (L) 10/16/2019   CO2 24 10/16/2019   GLUCOSE 158 (H) 10/16/2019   BUN 17 10/16/2019   CREATININE 1.22 10/16/2019   BILITOT 0.9 07/31/2019   ALKPHOS 64 07/31/2019   AST 30 07/31/2019   ALT 43 07/31/2019   PROT 7.3 07/31/2019   ALBUMIN 4.5 07/31/2019   CALCIUM 9.1 10/16/2019   GFRAA >60 10/16/2019    Speciality Comments: No specialty comments available.  Procedures:  No procedures performed Allergies: Patient has no known allergies.   Assessment / Plan:     Visit Diagnoses: Bilateral hand pain - Plan: Sedimentation rate, C-reactive protein, Rheumatoid factor, Cyclic citrul peptide antibody, IgG  Clinical picture looks most suspicious to me for psoriatic arthritis  based on the location of pain suggesting some enthesopathy, episodes of what sound like dactylitis, with his skin rashes and fingernail pitting on examination.  Will recheck the sedimentation rate and CRP for inflammatory disease assessment and monitoring.  Will check for rheumatoid factor and CCP screening for seropositive RA but I think this is less likely with a lot of distal joint involvement.  He is already had a thorough trial of prolonged NSAID medications so need to look at DMARD with peripheral joint involvement could try a small molecules versus Biologics if contraindicated or does not improve adequately.  Arthralgia, unspecified joint - Plan: XR Foot 2 Views Right, XR Foot 2 Views Left Acute left ankle pain - Plan: XR Foot 2 Views Right, XR Foot 2 Views Left  X-rays of bilateral feet demonstrate fairly minimal extent of osteoarthritis suspect more enthesopathy process likely as a cause of the foot and ankle pain that would be consistent with psoriatic disease.  Rash and other nonspecific skin eruption  Appears to be stable and nonsymptomatic and he is established with dermatology no specific additional treatment recommended at this time.  High risk medication use - Plan: CBC with Differential/Platelet, COMPLETE METABOLIC PANEL WITH GFR, Hepatitis B core antibody, IgM, Hepatitis B surface antigen, Hepatitis C antibody, QuantiFERON-TB Gold Plus  Checking baseline labs for medication monitoring discussing possible trial of methotrexate or alternate DMARD including biologic options.  Getting CBC CMP hepatitis serology and baseline QuantiFERON.  Orders: Orders Placed This Encounter  Procedures   XR Foot 2 Views Right   XR Foot 2 Views Left   Sedimentation rate   C-reactive protein   Rheumatoid factor   Cyclic citrul peptide antibody, IgG   CBC with Differential/Platelet   COMPLETE METABOLIC PANEL WITH GFR   Hepatitis B core antibody, IgM   Hepatitis B surface antigen   Hepatitis C  antibody   QuantiFERON-TB Gold Plus   No orders of the defined types were placed in this encounter.    Follow-Up Instructions: Return in about 2 weeks (around 07/31/2022) for New pt ?RA/?PsA f/u 2wks.   Collier Salina, MD  Note - This record has been created using [employer].  Chart creation errors have been sought, but may not always  have been located. Such creation errors do not reflect on  the standard of medical care.

## 2022-07-17 ENCOUNTER — Ambulatory Visit (INDEPENDENT_AMBULATORY_CARE_PROVIDER_SITE_OTHER): Payer: BC Managed Care – PPO

## 2022-07-17 ENCOUNTER — Ambulatory Visit: Payer: BC Managed Care – PPO | Attending: Internal Medicine | Admitting: Internal Medicine

## 2022-07-17 ENCOUNTER — Encounter: Payer: Self-pay | Admitting: Internal Medicine

## 2022-07-17 ENCOUNTER — Ambulatory Visit: Payer: No Typology Code available for payment source | Attending: Family Medicine

## 2022-07-17 VITALS — BP 119/83 | HR 76 | Resp 18 | Ht 71.0 in | Wt 233.0 lb

## 2022-07-17 DIAGNOSIS — M6281 Muscle weakness (generalized): Secondary | ICD-10-CM | POA: Insufficient documentation

## 2022-07-17 DIAGNOSIS — M25572 Pain in left ankle and joints of left foot: Secondary | ICD-10-CM

## 2022-07-17 DIAGNOSIS — M2550 Pain in unspecified joint: Secondary | ICD-10-CM

## 2022-07-17 DIAGNOSIS — R29898 Other symptoms and signs involving the musculoskeletal system: Secondary | ICD-10-CM | POA: Insufficient documentation

## 2022-07-17 DIAGNOSIS — M25571 Pain in right ankle and joints of right foot: Secondary | ICD-10-CM | POA: Diagnosis not present

## 2022-07-17 DIAGNOSIS — M79641 Pain in right hand: Secondary | ICD-10-CM | POA: Diagnosis not present

## 2022-07-17 DIAGNOSIS — M539 Dorsopathy, unspecified: Secondary | ICD-10-CM

## 2022-07-17 DIAGNOSIS — R21 Rash and other nonspecific skin eruption: Secondary | ICD-10-CM

## 2022-07-17 DIAGNOSIS — R293 Abnormal posture: Secondary | ICD-10-CM | POA: Insufficient documentation

## 2022-07-17 DIAGNOSIS — R700 Elevated erythrocyte sedimentation rate: Secondary | ICD-10-CM

## 2022-07-17 DIAGNOSIS — Z79899 Other long term (current) drug therapy: Secondary | ICD-10-CM

## 2022-07-17 DIAGNOSIS — M79642 Pain in left hand: Secondary | ICD-10-CM

## 2022-07-17 NOTE — Therapy (Addendum)
OUTPATIENT PHYSICAL THERAPY TREATMENT AND DISCHARGE SUMMARY   PHYSICAL THERAPY DISCHARGE SUMMARY  Visits from Start of Care: 8  Current functional level related to goals / functional outcomes: See progress note for discharge status    Remaining deficits: Continued pain and weakness    Education / Equipment: HEP    Patient agrees to discharge. Patient goals were not met. Patient is being discharged due to lack of progress. Celyn P. Helene Kelp PT, MPH 09/18/22 4:05 PM   Patient Name: James Mayer MRN: [id number] DOB:14-Nov-1980, 42 y.o., male Today's Date: 07/17/2022  END OF SESSION:  PT End of Session - 07/17/22 1105     Visit Number 8    Number of Visits 16    Date for PT Re-Evaluation 08/13/22    PT Start Time 1105    PT Stop Time 1145    PT Time Calculation (min) 40 min              Past Medical History:  Diagnosis Date   Hypertension    Low back pain    History reviewed. No pertinent surgical history. Patient Active Problem List   Diagnosis Date Noted   Bilateral hand pain 07/17/2022   Cervical spinal stenosis 06/11/2022   Serous otitis media 09/27/2021   Tinnitus, bilateral 02/06/2021   Anxiety disorder, unspecified 02/06/2021   Conduct disorder, unspecified 02/06/2021   Restless legs 01/15/2021   Neck pain 08/22/2020   Idiopathic peripheral neuropathy 08/22/2020   Cervical spondylosis 08/22/2020   Bronchitis 08/22/2020   Acute low back pain 01/27/2020   Acute left ankle pain 08/28/2019   Polydipsia 07/31/2019   Arthralgia 04/14/2019   Essential hypertension 08/26/2018   Chronic low back pain 08/26/2018    PCP: Dr Luetta Nutting  REFERRING PROVIDER: Dr Luetta Nutting  REFERRING DIAG: Cervical spinal stenosis; Lt shoulder pain/weakness   THERAPY DIAG:  Cervical dysfunction  Other symptoms and signs involving the musculoskeletal system  Abnormal posture  Muscle weakness (generalized)  Rationale for Evaluation and Treatment:  Rehabilitation  ONSET DATE: 05/25/22  SUBJECTIVE:                                                                                                                                                                                                         SUBJECTIVE STATEMENT: Patient reports he did some desk work and noticed he had less pain in neck and shoulder compared to in the past. Patient states he has appointment with rheumatoid MD where he had blood work and x-rays completed.  PERTINENT HISTORY:  HTN; chronic LBP  and intermittent cervical pain and radicular symptoms   PAIN:  Are you having pain? Yes: NPRS scale: 2/10; can get up to 5-6 end of day Pain location: neck and Lt UE in shoulder area into the back of the neck  Pain description: nagging; sharp with activity Aggravating factors: reaching out to side; lifting; reaching up; weight bearing UE's  Relieving factors: lying on Lt side; meds  PRECAUTIONS: None  WEIGHT BEARING RESTRICTIONS: No  FALLS:  Has patient fallen in last 6 months? 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  Does some household chores; computer; sitting in soft couch when relaxing  PATIENT GOALS: get strength back in the Lt shoulder; postpone surgery as long as possible   NEXT MD VISIT: Jan 16 with neurosurgeon, Next week will see rheumatologist  OBJECTIVE:  From eval unless otherwise noted  DIAGNOSTIC FINDINGS:  MRI showed severe left neural foraminal narrowing at C4-C5 and C5-C6 along with severe neural foraminal narrowing 2/2 to large left subarticular disc protrusion at C7-T1  SENSATION: Intermittent tingling and numbness in the Lt UE - mostly pain radiating into the Lt at times to finger tips    PALPATION: Muscular tightness Lt > Rt ant/lat/post cervical musculature; pecs; upper trap. Tenderness to PA mobs through the mid thoracic spine    CERVICAL ROM:   Symptoms on Lt  - sore and tight throughout  Active ROM A/PROM (deg) eval  Flexion 44  Extension  50  Right lateral flexion 36 sore  Left lateral flexion 36 sore   Right rotation 64  Left rotation 64   (Blank rows = not tested)  UPPER EXTREMITY ROM:  Active ROM Right eval Left eval  Shoulder flexion 147 117 tight/ painful   Shoulder extension 65 41 sore  Shoulder abduction 163 115 tight/ painful  Shoulder adduction    Shoulder extension    Shoulder internal rotation    Shoulder external rotation 80 shoulder 90 deg/elbow 90 deg 60 shoulder 90/elbow 90 deg - tight; painful  Elbow flexion    Elbow extension    Wrist flexion    Wrist extension    Wrist ulnar deviation    Wrist radial deviation    Wrist pronation    Wrist supination     (Blank rows = not tested)  UPPER EXTREMITY MMT:  MMT Right eval Left eval  Shoulder flexion 5/5 3+/5  Shoulder extension    Shoulder abduction 5/5 3-/5  Shoulder adduction    Shoulder extension    Shoulder internal rotation 5/5 4/5  Shoulder external rotation 5/5  4-/5  Middle trapezius    Lower trapezius    Elbow flexion    Elbow extension    Wrist flexion 5/5 5/5  Wrist extension 5/5 5-/5  Wrist ulnar deviation    Wrist radial deviation    Wrist pronation    Wrist supination    Grip strength     (Blank rows = not tested)  CERVICAL SPECIAL TESTS:  Compression/distraction - negative   GRIP STRENGTH: 06/26/22: Rt 55 #; Lt 42 #  [facility] Adult PT Treatment:                                                DATE: 07/17/2022 Therapeutic Exercise: Standing UBE warm up  64mn fwd/2 min bkwd L5 S/L L shoulder abd + scap MWM Ball ABCs with straight arm (unable to complete on wall, continued on table) Standing SA arm wall slides (isometric abd on strap) x10 Prone shoulder flex/scap x 5 each Seated L arm slides with stretch flex/scap Manual Therapy: STM/TPR subscap (varying arm placement) S/L scap retrac/protract MWM Supine STM  pecs, subscap    [facility] Adult PT Treatment:                                                DATE: 07/12/2022 Therapeutic Exercise: Standing UBE L 5 fwd/bkwd 2 min each Standing doorway pec stretch low, mid, high 2x30" S/L L shoulder abd + scap MWM Seated scapular clocks Seated L shoulder ER YTB x10 Prone shoulder flex/scap x 5 each Shoulder flexion arm wall slide Manual Therapy: Supine L shoudler passive abd ROM with oscillations STM pecs, subscap S/L scap retrac/protract MWM   PATIENT EDUCATION:  Education details: Ergonomic modifications at work for posture Person educated: Patient Education method: EConsulting civil engineer DMedia planner TCorporate treasurercues, Verbal cues, and Handouts Education comprehension: verbalized understanding, returned demonstration, verbal cues required, tactile cues required, and needs further education  HOME EXERCISE PROGRAM: Access Code: [email or web address]: [email or web address] Date: 07/12/2022 Prepared by: KHelane Gunther Exercises - Seated Cervical Retraction  - 3 x daily - 7 x weekly - 1 sets - 10 reps - Standing Scapular Retraction  - 3 x daily - 7 x weekly - 1 sets - 10 reps - 10 hold - Shoulder External Rotation and Scapular Retraction  - 3 x daily - 7 x weekly - 1 sets - 10 reps -   hold - Doorway Pec Stretch at 60 Degrees Abduction  - 3 x daily - 7 x weekly - 1 sets - 3 reps - Doorway Pec Stretch at 90 Degrees Abduction  - 3 x daily - 7 x weekly - 1 sets - 3 reps - 30 seconds  hold - Doorway Pec Stretch at 120 Degrees Abduction  - 3 x daily - 7 x weekly - 1 sets - 3 reps - 30 second hold  hold - Standing Pectoral Release with Shoulder Abduction with Ball at Wall  - 2-3 x daily - 7 x weekly - Sidelying Thoracic Rotation with Open Book  - 1 x daily - 7 x weekly - 1 sets - 10 reps - 3 sec hold - Seated Thoracic Lumbar Extension with Pectoralis Stretch  - 1 x daily - 7 x weekly - 1 sets - 10 reps - 3 sec hold - Seated Cervical Sidebending AROM  - 2 x daily  - 7 x weekly - 1 sets - 5 reps - 5-10 sec  hold - Standing Shoulder Row Reactive Isometric  - 2 x daily - 7 x weekly - 1 sets - 10 reps - 30-45 sec  hold - Shoulder extension with resistance - Neutral  - 1 x daily - 7 x weekly - 1-2 sets - 10 reps - 3-5 sec  hold - Seated Scapular Clock (1 to 7)  - 1 x daily - 7 x weekly - 3 sets - 10 reps  ASSESSMENT:  CLINICAL IMPRESSION: Patient continues to demonstrate significant upper trapezius compensation when lifting L arm above 90 degrees. Manual interventions continued to decrease tension and improve scapular mobility. Mild improvement in postural awareness noted, however verbal  and tactile cues continued to be needed to improve proprioceptive awareness of scapular mobility with L shoulder AROM.  OBJECTIVE IMPAIRMENTS: decreased activity tolerance, decreased mobility, decreased ROM, decreased strength, increased fascial restrictions, increased muscle spasms, impaired flexibility, impaired sensation, impaired UE functional use, improper body mechanics, postural dysfunction, and pain.    GOALS: Goals reviewed with patient? Yes  SHORT TERM GOALS: Target date: 07/17/2022   Independent in initial HEP  Baseline: no exercises Goal status: INITIAL 1/2: IN PROGRESS  2.  Verbalizes and/or demonstrates proper sitting w/ assist as needed  Baseline: poor posture in sitting  Goal status: INITIAL 1/2: PROGRESSING  LONG TERM GOALS: Target date: 08/13/2022   Improve posture and alignment with patient to demonstrated improved alignment with posterior shoulder girdle engaged Baseline: see eval Goal status: INITIAL  2.  Increase AROM Lt shoulder to within 5 degrees of AROM Rt shoulder with minimal to no pain Baseline:  Goal status: INITIAL  3.  Increased AROM cervical spine in all planes by 3-5 degrees with minimal to no pain  Baseline:  Goal status: INITIAL  4.  Increase strength Lt UE to =/> 4/5 in all planes  Baseline: see eval Goal status:  INITIAL  5.  Independent in HEP Baseline:  Goal status: INITIAL   PLAN:  PT FREQUENCY: 2x/week  PT DURATION: 8 weeks  PLANNED INTERVENTIONS: Therapeutic exercises, Therapeutic activity, Neuromuscular re-education, Patient/Family education, Self Care, Joint mobilization, Aquatic Therapy, Dry Needling, Spinal mobilization, Cryotherapy, Moist heat, Taping, Traction, Ultrasound, Ionotophoresis '4mg'$ /ml Dexamethasone, Manual therapy, and Re-evaluation  PLAN FOR NEXT SESSION: Continue with postural education/correction; manual work and/or dry needling as indicated; modalities as indicated; assess response to TENS for pain management and cervical traction   Hardin Negus, PTA 07/17/2022, 11:55 AM

## 2022-07-18 ENCOUNTER — Telehealth: Payer: Self-pay | Admitting: Internal Medicine

## 2022-07-18 NOTE — Telephone Encounter (Signed)
Glucose- 450

## 2022-07-18 NOTE — Telephone Encounter (Signed)
[facility] left a voicemail with urgent lab results for the patient.  The priority results have been faxed.  If you have any questions or do not receive the results, please call back at [phone] and provide the ref #[id number]

## 2022-07-19 ENCOUNTER — Ambulatory Visit: Payer: BC Managed Care – PPO

## 2022-07-19 LAB — CBC WITH DIFFERENTIAL/PLATELET
Absolute Monocytes: 420 cells/uL (ref 200–950)
Basophils Absolute: 91 cells/uL (ref 0–200)
Basophils Relative: 1.3 %
Eosinophils Absolute: 140 cells/uL (ref 15–500)
Eosinophils Relative: 2 %
HCT: 44.3 % (ref 38.5–50.0)
Hemoglobin: 15.2 g/dL (ref 13.2–17.1)
Lymphs Abs: 1498 cells/uL (ref 850–3900)
MCH: 28.2 pg (ref 27.0–33.0)
MCHC: 34.2 g/dL (ref 32.0–36.0)
MCV: 82.2 fL (ref 80.0–100.0)
MPV: 10.6 fL (ref 7.5–12.5)
Monocytes Relative: 6 %
Neutro Abs: 4851 cells/uL (ref 1500–7800)
Neutrophils Relative %: 69.3 %
Platelets: 325 10*3/uL (ref 140–400)
RBC: 5.39 10*6/uL (ref 4.20–5.80)
RDW: 13.3 % (ref 11.0–15.0)
Total Lymphocyte: 21.4 %
WBC: 7 10*3/uL (ref 3.8–10.8)

## 2022-07-19 LAB — QUANTIFERON-TB GOLD PLUS
Mitogen-NIL: >10 IU/mL
NIL: 0.07 IU/mL
QuantiFERON-TB Gold Plus: NEGATIVE
TB1-NIL: 0.01 IU/mL
TB2-NIL: 0.01 IU/mL

## 2022-07-19 LAB — COMPLETE METABOLIC PANEL WITH GFR
AG Ratio: 1.8 (calc) (ref 1.0–2.5)
ALT: 91 U/L — ABNORMAL HIGH (ref 9–46)
AST: 54 U/L — ABNORMAL HIGH (ref 10–40)
Albumin: 4.8 g/dL (ref 3.6–5.1)
Alkaline phosphatase (APISO): 132 U/L — ABNORMAL HIGH (ref 36–130)
BUN/Creatinine Ratio: 14 (calc) (ref 6–22)
BUN: 20 mg/dL (ref 7–25)
CO2: 25 mmol/L (ref 20–32)
Calcium: 10.6 mg/dL — ABNORMAL HIGH (ref 8.6–10.3)
Chloride: 91 mmol/L — ABNORMAL LOW (ref 98–110)
Creat: 1.43 mg/dL — ABNORMAL HIGH (ref 0.60–1.26)
Globulin: 2.6 g/dL (calc) (ref 1.9–3.7)
Glucose, Bld: 450 mg/dL — ABNORMAL HIGH (ref 65–99)
Potassium: 4 mmol/L (ref 3.5–5.3)
Sodium: 131 mmol/L — ABNORMAL LOW (ref 135–146)
Total Bilirubin: 1.1 mg/dL (ref 0.2–1.2)
Total Protein: 7.4 g/dL (ref 6.1–8.1)
eGFR: 64 mL/min/{1.73_m2} (ref >=60)

## 2022-07-19 LAB — C-REACTIVE PROTEIN: CRP: 26.8 mg/L — ABNORMAL HIGH (ref <8.0)

## 2022-07-19 LAB — HEPATITIS B SURFACE ANTIGEN: Hepatitis B Surface Ag: NONREACTIVE

## 2022-07-19 LAB — RHEUMATOID FACTOR: Rheumatoid fact SerPl-aCnc: <14 IU/mL (ref <14)

## 2022-07-19 LAB — HEPATITIS C ANTIBODY: Hepatitis C Ab: NONREACTIVE

## 2022-07-19 LAB — HEPATITIS B CORE ANTIBODY, IGM: Hep B C IgM: NONREACTIVE

## 2022-07-19 LAB — CYCLIC CITRUL PEPTIDE ANTIBODY, IGG: Cyclic Citrullin Peptide Ab: <16 UNITS

## 2022-07-19 LAB — SEDIMENTATION RATE: Sed Rate: 36 mm/h — ABNORMAL HIGH (ref 0–15)

## 2022-07-23 ENCOUNTER — Ambulatory Visit: Payer: BC Managed Care – PPO

## 2022-07-27 ENCOUNTER — Encounter (INDEPENDENT_AMBULATORY_CARE_PROVIDER_SITE_OTHER): Payer: BC Managed Care – PPO | Admitting: Family Medicine

## 2022-07-27 DIAGNOSIS — G8929 Other chronic pain: Secondary | ICD-10-CM | POA: Diagnosis not present

## 2022-07-27 DIAGNOSIS — M545 Low back pain, unspecified: Secondary | ICD-10-CM

## 2022-07-27 MED ORDER — PREDNISONE 50 MG PO TABS: ORAL_TABLET | ORAL | 0 refills | Status: DC

## 2022-07-27 NOTE — Telephone Encounter (Signed)
I called James Mayer to inform him about the high blood glucose and this level would only be expected in a patient with diabetes. He has no prior diagnosis of diabetes, by review of previous metabolic panels glucose has been mildly high but not excessively so for a random glucose. He continues to drink a lot of water daily and I recommended he keep doing so to avoid risk of dehydration with hyperglycemia. I sent a message to his PCP and also encouraged him to contact their office about making a follow up appointment to evaluate this further.

## 2022-07-27 NOTE — Telephone Encounter (Signed)
Please see the MyChart message reply(ies) for my assessment and plan.    This patient gave consent for this Medical Advice Message and is aware that it may result in a bill to their insurance company, as well as the possibility of receiving a bill for a co-payment or deductible. They are an established patient, but are not seeking medical advice exclusively about a problem treated during an in person or video visit in the last seven days. I did not recommend an in person or video visit within seven days of my reply.    I spent a total of 6 minutes cumulative time within 7 days through MyChart messaging.  Chenay Nesmith, DO   

## 2022-08-02 NOTE — Progress Notes (Signed)
Pt has been scheduled for 08/2022

## 2022-08-06 ENCOUNTER — Other Ambulatory Visit: Payer: Self-pay | Admitting: Family Medicine

## 2022-08-13 NOTE — Progress Notes (Signed)
Office Visit Note  Patient: James Mayer             Date of Birth: 08/14/1980           MRN: [id number]             PCP: Everrett Coombe, DO Referring: Everrett Coombe, DO Visit Date: 08/14/2022   Subjective:  Follow-up (Bil hand pain and swelling)   History of Present Illness: James Mayer is a 42 y.o. male here for follow up for psoriatic arthritis.  Initial evaluation demonstrated elevated serum inflammatory markers with some inflammatory appearing peripheral joint pain and digital pitting changes.  Lab findings also had markedly elevated serum glucose he has been scheduled follow-up with his PCP office in about 2 weeks for further evaluation.  Liver function test showed mild elevations in AST and ALT.  He had some exacerbation of joint pains especially in his back and bilateral hands with an increase in swelling and difficulty closing his grip and took another course of oral prednisone starting January 12 for this which did improve symptoms again.  He is also scheduled for nerve conduction study based on his cervical spine disease with suspected impingement or radiculopathy and plans to follow-up further with his neurosurgeon regarding these results.  Previous HPI 07/17/22 [name] is a 42 y.o. male  here for evaluation of joint pains and elevated CRP. He has ongoing neck and back pain and [facility] which showed severe left neural foraminal narrowing at C4-C5 and C5-C6 along with severe neural foraminal narrowing 2/2 to large left subarticular disc protrusion at C7-T1. Also with bilateral hand pain and intermittent swelling and mild xray changes concerning for possible RA. Overall a lot of his joint pains have been going on for years with some gradual progression over time.  He has taken meloxicam primarily prescribed for his back with some loss of efficacy since switching to diclofenac he is seeing more improvement in symptoms.  He does definitely  notice worsening of symptoms if he misses any of the twice daily dosing.  Biggest active problems right now in the neck back hands and feet.  He sometimes sees swelling of an entire finger joint at a time most recently the left third finger has been worst affected.  He has trouble tightly gripping and feels some decrease in grip strength.  He has noticed ulnar deviation and inability to fully close the second and third finger affecting both hands.  Pain is worse after heavy use or activity but also experiences pain at night and early morning if he misses any doses of the diclofenac. He experiences some skin rashes which have also been longstanding occurring on the flexor surfaces of his elbows and skin fold on the chest.  Reports dermatologist had expressed a concern for inverse pityriasis rosea but no topical treatment helped significantly, although also does not hurt or itch.  He has some pitting affecting fingernails on both hands.     Labs reviewed ESR 22 CRP 18.0 Uric acid 4.9   04/11/22 Xray right hand IMPRESSION: 1. Mild ulnar deviation of the second and third digits at the metacarpophalangeal joints. This is nonspecific, but may be an early manifestation of inflammatory arthropathy such as rheumatoid. 2. There is trace associated spurring of the second and third metacarpal phalangeal joints.   04/11/22 Xray left hand IMPRESSION: Possible slight ulnar subluxation of the thumb proximal phalanx at the metacarpophalangeal joint, nonspecific. Trace radiocarpal, typically  degenerative. No specific findings to suggest inflammatory arthropathy.   Review of Systems  Constitutional:  Positive for fatigue.  HENT:  Positive for mouth dryness. Negative for mouth sores.   Eyes:  Negative for dryness.  Respiratory:  Negative for shortness of breath.   Cardiovascular:  Negative for chest pain and palpitations.  Gastrointestinal:  Positive for constipation. Negative for blood in stool and diarrhea.   Endocrine: Positive for increased urination.  Genitourinary:  Negative for involuntary urination.  Musculoskeletal:  Positive for joint pain, joint pain, joint swelling, myalgias, muscle weakness, morning stiffness and myalgias. Negative for gait problem and muscle tenderness.  Skin:  Positive for color change and sensitivity to sunlight. Negative for rash and hair loss.  Allergic/Immunologic: Negative for susceptible to infections.  Neurological:  Negative for dizziness and headaches.  Hematological:  Negative for swollen glands.  Psychiatric/Behavioral:  Positive for depressed mood and sleep disturbance. The patient is nervous/anxious.     PMFS History:  Patient Active Problem List   Diagnosis Date Noted   Psoriatic arthritis (HCC) 08/14/2022   High risk medication use 08/14/2022   Bilateral hand pain 07/17/2022   Cervical spinal stenosis 06/11/2022   Serous otitis media 09/27/2021   Tinnitus, bilateral 02/06/2021   Anxiety disorder, unspecified 02/06/2021   Conduct disorder, unspecified 02/06/2021   Restless legs 01/15/2021   Neck pain 08/22/2020   Idiopathic peripheral neuropathy 08/22/2020   Cervical spondylosis 08/22/2020   Bronchitis 08/22/2020   Acute low back pain 01/27/2020   Acute left ankle pain 08/28/2019   Polydipsia 07/31/2019   Arthralgia 04/14/2019   Essential hypertension 08/26/2018   Chronic low back pain 08/26/2018    Past Medical History:  Diagnosis Date   Hypertension    Low back pain     Family History  Problem Relation Age of Onset   Hypertension Father    Hyperlipidemia Father    Diabetes Sister    Diabetes Maternal Grandmother    Cancer Maternal Grandmother    Cancer Maternal Grandfather    Diabetes Paternal Grandmother    Neurologic Disorder Paternal Grandfather    History reviewed. No pertinent surgical history. Social History   Social History Narrative   Live w wife and 2 daughter full time and 2 other children 60% of the time    Right handed   Caffeine: decaf tea and coffee   Immunization History  Administered Date(s) Administered   Influenza, Seasonal, Injecte, Preservative Fre 07/15/2015   Moderna Sars-Covid-2 Vaccination 02/01/2020, 03/01/2020   Td (Adult) 09/28/2016   Tdap 07/16/2006     Objective: Vital Signs: BP 127/73 (BP Location: Left Arm, Patient Position: Sitting, Cuff Size: Normal)   Pulse 71   Resp 14   Ht 5\' 11"  (1.803 m)   Wt 233 lb (105.7 kg)   BMI 32.50 kg/m    Physical Exam Constitutional:      Appearance: He is obese.  Cardiovascular:     Rate and Rhythm: Normal rate and regular rhythm.  Pulmonary:     Effort: Pulmonary effort is normal.     Breath sounds: Normal breath sounds.  Musculoskeletal:     Right lower leg: No edema.     Left lower leg: No edema.  Skin:    Findings: Rash present.     Comments: Pitting on fingernails of both hands with no other distal finger changes Faintly erythematous well-demarcated skin rash on flexural surfaces of both elbows with no overlying scale  Neurological:     Mental Status: He  is alert.      Musculoskeletal Exam:  Left shoulder unable to actively abduct overhead, passive ROM intact with some pain Elbows full ROM no tenderness or swelling Left wrist tenderness to pressure on the flexor side and radial side of joint pain base of the thumb There is some flexor tendon swelling with palpable nodule proximal to the third MCP and overlying the proximal phalanx with slightly decreased flexion and extension range of motion tolerated, decreased range of motion in the right second and third MCPs without palpable nodule or synovitis Knees full ROM no tenderness or swelling   Investigation: No additional findings.  Imaging: XR Foot 2 Views Right  Result Date: 07/17/2022 X-ray right foot 2 views Tibiotalar joint space appears normal.  Moderate posterior calcaneal enthesophytes present.  Appears to be lateral dorsal midfoot bone spurring or  possible remote injury changes with some subchondral cyst.  Mild degenerative changes of first MTP joint and lateral deviation of first IP with enthesophyte process.  Other MTPs and IP joints appear normal.  No erosions seen. Impression Enthesophytes and mild degenerative changes could be consistent with a chronic enthesopathy versus early osteoarthritis  XR Foot 2 Views Left  Result Date: 07/17/2022 X-ray left foot 2 views Tibiotalar joint space appears normal.  Moderate posterior and plantar calcaneal enthesophytes present.  Appears to be lateral dorsal midfoot bone spurring or possible remote injury changes with some subchondral cyst.  Mild degenerative changes of first MTP joint and lateral deviation of first IP with enthesophyte process.  Other MTPs and IP joints appear normal.  No erosions seen. Impression Enthesophytes and mild degenerative changes could be consistent with a chronic enthesopathy versus early osteoarthritis   Recent Labs: Lab Results  Component Value Date   WBC 7.0 07/17/2022   HGB 15.2 07/17/2022   PLT 325 07/17/2022   NA 131 (L) 07/17/2022   K 4.0 07/17/2022   CL 91 (L) 07/17/2022   CO2 25 07/17/2022   GLUCOSE 450 (H) 07/17/2022   BUN 20 07/17/2022   CREATININE 1.43 (H) 07/17/2022   BILITOT 1.1 07/17/2022   ALKPHOS 64 07/31/2019   AST 54 (H) 07/17/2022   ALT 91 (H) 07/17/2022   PROT 7.4 07/17/2022   ALBUMIN 4.5 07/31/2019   CALCIUM 10.6 (H) 07/17/2022   GFRAA >60 10/16/2019   QFTBGOLDPLUS NEGATIVE 07/17/2022    Speciality Comments: No specialty comments available.  Procedures:  No procedures performed Allergies: Patient has no known allergies.   Assessment / Plan:     Visit Diagnoses: Psoriatic arthritis (HCC)  Clinical picture appears consistent with psoriatic arthritis with definite tenosynovitis also increased joint pain in other areas.  I do not believe he is a candidate for methotrexate due to his baseline abnormal liver function tests.  Discussed  treatment options including Otezla versus injectable biologic medication given at high amount of disease activity and inflammation including axial joint involvement I think biologic DMARD be best option.  Recommend starting Cosentyx subcu for efficacy and least risk of affecting liver function changes.  Will continue his diclofenac for symptom control at this time trying to minimize steroid exposure with uncontrolled hyperglycemia.  High risk medication use  Baseline labs obtained at initial visit look okay for starting Cosentyx.  Discussed risk of medication including drug allergies, injection site reactions, increased risk of infections, need for intermittent follow-up for medication monitoring including tuberculosis screening.  He does not have any known history of inflammatory bowel disease.  Cervical spondylosis  I agree with his  ongoing evaluation and workup with nerve conduction study and neurosurgery follow-up planned.  There may be some component of bursitis or tendinitis in the shoulder with his psoriatic arthritis limiting the range of motion but I do not think this accounts for nerve related changes.  Orders: No orders of the defined types were placed in this encounter.  No orders of the defined types were placed in this encounter.    Follow-Up Instructions: Return in about 10 weeks (around 10/23/2022) for PsA start COS f/u 10wks.   Fuller Plan, MD  Note - This record has been created using [employer].  Chart creation errors have been sought, but may not always  have been located. Such creation errors do not reflect on  the standard of medical care.

## 2022-08-14 ENCOUNTER — Encounter: Payer: Self-pay | Admitting: Internal Medicine

## 2022-08-14 ENCOUNTER — Ambulatory Visit: Payer: No Typology Code available for payment source | Attending: Internal Medicine | Admitting: Internal Medicine

## 2022-08-14 ENCOUNTER — Telehealth: Payer: Self-pay | Admitting: Pharmacist

## 2022-08-14 VITALS — BP 127/73 | HR 71 | Resp 14 | Ht 71.0 in | Wt 233.0 lb

## 2022-08-14 DIAGNOSIS — M47812 Spondylosis without myelopathy or radiculopathy, cervical region: Secondary | ICD-10-CM

## 2022-08-14 DIAGNOSIS — L4050 Arthropathic psoriasis, unspecified: Secondary | ICD-10-CM | POA: Insufficient documentation

## 2022-08-14 DIAGNOSIS — Z79899 Other long term (current) drug therapy: Secondary | ICD-10-CM | POA: Diagnosis not present

## 2022-08-14 MED ORDER — DICLOFENAC SODIUM 75 MG PO TBEC: 75.0000 mg | DELAYED_RELEASE_TABLET | Freq: Two times a day (BID) | ORAL | 2 refills | Status: DC | PRN

## 2022-08-14 NOTE — Patient Instructions (Signed)
Secukinumab Injection What is this medication? SECUKINUMAB (sek ue KIN ue mab) treats autoimmune conditions, such as psoriasis and arthritis. It works by slowing down an overactive immune system. It is a monoclonal antibody. This medicine may be used for other purposes; ask your health care provider or pharmacist if you have questions. COMMON BRAND NAME(S): Cosentyx What should I tell my care team before I take this medication? They need to know if you have any of these conditions: Crohn's disease, ulcerative colitis, or other inflammatory bowel disease Immune system problems Infection or history of infection, such as a viral infection, chickenpox, cold sores, or herpes Recently received or are scheduled to receive a vaccine Tuberculosis, a positive skin test for tuberculosis, or recent close contact with someone who has tuberculosis An unusual or allergic reaction to secukinumab, latex, rubber, other medications, foods, dyes, or preservatives Pregnant or trying to get pregnant Breast-feeding How should I use this medication? This medication is injected under the skin. It can be given by your care team in a hospital or clinic setting. It may also be given at home. If you get this medication at home, you will be taught how to prepare and give it. Use it exactly as directed. Take it as directed on the prescription label. Keep taking it unless your care team tells you to stop. It is important that you put your used needles and syringes in a special sharps container. Do not put them in a trash can. If you do not have a sharps container, call your pharmacist or care team to get one. A special MedGuide will be given to you by the pharmacist with each prescription and refill. Be sure to read this information carefully each time. Talk to your care team about the use of this medication in children. While it may be prescribed for children as young as 2 years for selected conditions, precautions do  apply. Overdosage: If you think you have taken too much of this medicine contact a poison control center or emergency room at once. NOTE: This medicine is only for you. Do not share this medicine with others. What if I miss a dose? If you get this medication at the hospital or clinic: It is important not to miss your dose. Call your care team if you are unable to keep an appointment. If you give yourself this medication at home: If you miss a dose, take it as soon as you can. If it is almost time for your next dose, take only that dose. Do not take double or extra doses. Call your care team with questions. What may interact with this medication? Live virus vaccines This list may not describe all possible interactions. Give your health care provider a list of all the medicines, herbs, non-prescription drugs, or dietary supplements you use. Also tell them if you smoke, drink alcohol, or use illegal drugs. Some items may interact with your medicine. What should I watch for while using this medication? Visit your care team for regular checks on your progress. Tell your care team if your symptoms do not start to get better or if they get worse. You will be tested for tuberculosis (TB) before you start this medication. If your care team prescribes any medication for TB, you should start taking the TB medication before starting this medication. Make sure to finish the full course of TB medication. This medication may increase your risk of getting an infection. Call your care team for advice if you get a fever,  chills, sore throat, or other symptoms of a cold or flu. Do not treat yourself. Try to avoid being around people who are sick. This medication can decrease the response to a vaccine. If you need to get vaccinated, tell your care team if you have received this medication within the last 6 months. Extra booster doses may be needed. Talk to your care team to see if a different vaccination schedule is  needed. What side effects may I notice from receiving this medication? Side effects that you should report to your care team as soon as possible: Allergic reactions--skin rash, itching, hives, swelling of the face, lips, tongue, or throat Dry, itchy, scaly patches of skin that blister or peel Infection--fever, chills, cough, sore throat, wounds that don't heal, pain or trouble when passing urine, general feeling of discomfort or being unwell Sudden or severe stomach pain, bloody diarrhea, fever, nausea, vomiting Side effects that usually do not require medical attention (report these to your care team if they continue or are bothersome): Diarrhea Runny or stuffy nose Sore throat This list may not describe all possible side effects. Call your doctor for medical advice about side effects. You may report side effects to FDA at 1-800-FDA-1088. Where should I keep my medication? Keep out of the reach of children and pets. Store in the refrigerator. Do not freeze. Keep it in the original carton until you are ready to use it. Protect from light. Do not shake. Remove the dose from the refrigerator about 30 minutes before it is time for you to use it. Use it within 4 days of removing it from the carton. Get rid of any unused medication after the expiration date. To get rid of medications that are no longer needed or have expired: Take the medication to a medication take-back program. Check with your pharmacy or law enforcement to find a location. If you cannot return the medication, ask your pharmacist or care team how to get rid of this medication safely. NOTE: This sheet is a summary. It may not cover all possible information. If you have questions about this medicine, talk to your doctor, pharmacist, or health care provider.  2023 Elsevier/Gold Standard (2013-08-06 00:00:00)

## 2022-08-14 NOTE — Telephone Encounter (Addendum)
Patient appears to have PsA and no clear diagnosis noticed for psoriasis. Will confirm with Dr. Benjamine Mola tomorrow to ensure correct Cosentyx dosing  Knox Saliva, PharmD, MPH, BCPS, CPP Clinical Pharmacist (Rheumatology and Pulmonology)   ----- Message from Shona Needles, RT sent at 08/14/2022  1:30 PM EST ----- Regarding: NEW START COSENTYX

## 2022-08-15 NOTE — Telephone Encounter (Signed)
Dose for Cosentyx will be just for PsA: with a loading dose: 150 mg SQ at weeks 0, 1, 2, 3, and 4 followed by 150 mg every 4 weeks  Knox Saliva, PharmD, MPH, BCPS, CPP Clinical Pharmacist (Rheumatology and Pulmonology)

## 2022-08-17 ENCOUNTER — Telehealth: Payer: Self-pay | Admitting: Pharmacist

## 2022-08-17 NOTE — Telephone Encounter (Signed)
Please start TALTZ SQ BIV.  Dose: 160mg  SQ at Week 0 then 80mg  every 4 weeks thereafter  Dx: Psoriatic arthritis (L40.5)  Knox Saliva, PharmD, MPH, BCPS, CPP Clinical Pharmacist (Rheumatology and Pulmonology)

## 2022-08-17 NOTE — Telephone Encounter (Signed)
I spoke with patient and he is okay with switch to [employer]. Advised of dosing of Taltz for PsA is 160mg  at Week 0 then 80mg  SQ every 4 weeks thereafter . Reviewed that it is also an IL17 inhibitor like Cosentyx and entirely different class than Humira.  Patient agreeable.  Knox Saliva, PharmD, MPH, BCPS, CPP Clinical Pharmacist (Rheumatology and Pulmonology)

## 2022-08-17 NOTE — Telephone Encounter (Signed)
I am fine with Taltz. Please update the patient about a switch though. Risk profile and monitoring is the same so shouldn't be an issue.

## 2022-08-17 NOTE — Telephone Encounter (Signed)
Submitted a Prior Authorization request to [employer] for TALTZ via CoverMyMeds. Will update once we receive a response.  Key: [id number]  Knox Saliva, PharmD, MPH, BCPS, CPP Clinical Pharmacist (Rheumatology and Pulmonology)

## 2022-08-17 NOTE — Telephone Encounter (Signed)
Initiated a Prior Authorization request to [employer] for [employer] via CoverMyMeds.   Key: Domenic Schwab

## 2022-08-21 NOTE — Telephone Encounter (Signed)
Received a fax regarding Prior Authorization from [employer] for [place]. Authorization has been DENIED because it is non-preferred option. Patient must try TWO of the following: Enbrel, adalimumab, Rutherford Nail, Rinvoq, [place], [place], Pearla Dubonnet, or Blakely.  Taltz BIV is already in process  Knox Saliva, PharmD, MPH, BCPS, CPP Clinical Pharmacist (Rheumatology and Pulmonology)

## 2022-08-23 ENCOUNTER — Other Ambulatory Visit (HOSPITAL_COMMUNITY): Payer: Self-pay

## 2022-08-23 NOTE — Telephone Encounter (Signed)
Received notification from [place] [facility] regarding a prior authorization for [place]. Authorization has been APPROVED from 07/18/2022 to 02/13/2023.    Per test claim, copay for 28 days supply is $62  Patient must fill through [place]: [phone]  Authorization # [id number]  Called patient to review. Will need consent for Taltz signed at new start visit. Will work with patient at new start visit to enroll into [facility] copay card program.  James Mayer new start visit is scheduled for 08/29/2022.  James Mayer, PharmD, MPH, BCPS, CPP Clinical Pharmacist (Rheumatology and Pulmonology)

## 2022-08-27 ENCOUNTER — Encounter: Payer: Self-pay | Admitting: Family Medicine

## 2022-08-27 ENCOUNTER — Ambulatory Visit (INDEPENDENT_AMBULATORY_CARE_PROVIDER_SITE_OTHER): Payer: 59 | Admitting: Family Medicine

## 2022-08-27 VITALS — BP 118/71 | HR 76 | Ht 72.0 in | Wt 231.0 lb

## 2022-08-27 DIAGNOSIS — E119 Type 2 diabetes mellitus without complications: Secondary | ICD-10-CM | POA: Insufficient documentation

## 2022-08-27 DIAGNOSIS — R7309 Other abnormal glucose: Secondary | ICD-10-CM

## 2022-08-27 DIAGNOSIS — R202 Paresthesia of skin: Secondary | ICD-10-CM | POA: Insufficient documentation

## 2022-08-27 DIAGNOSIS — L4050 Arthropathic psoriasis, unspecified: Secondary | ICD-10-CM | POA: Diagnosis not present

## 2022-08-27 DIAGNOSIS — R4587 Impulsiveness: Secondary | ICD-10-CM | POA: Insufficient documentation

## 2022-08-27 DIAGNOSIS — F419 Anxiety disorder, unspecified: Secondary | ICD-10-CM | POA: Diagnosis not present

## 2022-08-27 LAB — POCT UA - MICROALBUMIN
Creatinine, POC: 50 mg/dL
Microalbumin Ur, POC: 30 mg/L

## 2022-08-27 LAB — POCT GLYCOSYLATED HEMOGLOBIN (HGB A1C)
HbA1c POC (<> result, manual entry): >14 % (ref 4.0–5.6)
Hemoglobin A1C: 14 % — AB (ref 4.0–5.6)

## 2022-08-27 MED ORDER — BLOOD GLUCOSE TEST VI STRP: 1.0000 | ORAL_STRIP | Freq: Three times a day (TID) | 0 refills | Status: AC

## 2022-08-27 MED ORDER — RYBELSUS 7 MG PO TABS: 7.0000 mg | ORAL_TABLET | Freq: Every day | ORAL | 1 refills | Status: DC

## 2022-08-27 MED ORDER — LANCET DEVICE MISC: 1.0000 | Freq: Three times a day (TID) | 0 refills | Status: AC

## 2022-08-27 MED ORDER — BLOOD GLUCOSE MONITORING SUPPL DEVI: 1.0000 | Freq: Every day | 0 refills | Status: AC

## 2022-08-27 MED ORDER — METFORMIN HCL ER 500 MG PO TB24: 500.0000 mg | ORAL_TABLET | Freq: Every day | ORAL | 1 refills | Status: DC

## 2022-08-27 MED ORDER — RYBELSUS 3 MG PO TABS: 3.0000 mg | ORAL_TABLET | Freq: Every day | ORAL | 0 refills | Status: DC

## 2022-08-27 MED ORDER — LANCETS MISC. MISC: 1.0000 | Freq: Three times a day (TID) | 0 refills | Status: AC

## 2022-08-27 NOTE — Patient Instructions (Addendum)
Start metformin 571m daily Start Rybelsus 358mdaily x30 days then increase to 6m28m  Check blood sugars once daily.  We can do a continuous glucose monitor if you would like this as well.  See me again in 3 months.     Diabetes Mellitus and Nutrition, Adult When you have diabetes, or diabetes mellitus, it is very important to have healthy eating habits because your blood sugar (glucose) levels are greatly affected by what you eat and drink. Eating healthy foods in the right amounts, at about the same times every day, can help you: Manage your blood glucose. Lower your risk of heart disease. Improve your blood pressure. Reach or maintain a healthy weight. What can affect my meal plan? Every person with diabetes is different, and each person has different needs for a meal plan. Your health care provider may recommend that you work with a dietitian to make a meal plan that is best for you. Your meal plan may vary depending on factors such as: The calories you need. The medicines you take. Your weight. Your blood glucose, blood pressure, and cholesterol levels. Your activity level. Other health conditions you have, such as heart or kidney disease. How do carbohydrates affect me? Carbohydrates, also called carbs, affect your blood glucose level more than any other type of food. Eating carbs raises the amount of glucose in your blood. It is important to know how many carbs you can safely have in each meal. This is different for every person. Your dietitian can help you calculate how many carbs you should have at each meal and for each snack. How does alcohol affect me? Alcohol can cause a decrease in blood glucose (hypoglycemia), especially if you use insulin or take certain diabetes medicines by mouth. Hypoglycemia can be a life-threatening condition. Symptoms of hypoglycemia, such as sleepiness, dizziness, and confusion, are similar to symptoms of having too much alcohol. Do not drink alcohol  if: Your health care provider tells you not to drink. You are pregnant, may be pregnant, or are planning to become pregnant. If you drink alcohol: Limit how much you have to: 0-1 drink a day for women. 0-2 drinks a day for men. Know how much alcohol is in your drink. In the [place] one drink equals one 12 oz bottle of beer (355 mL), one 5 oz glass of wine (148 mL), or one 1 oz glass of hard liquor (44 mL). Keep yourself hydrated with water, diet soda, or unsweetened iced tea. Keep in mind that regular soda, juice, and other mixers may contain a lot of sugar and must be counted as carbs. What are tips for following this plan?  Reading food labels Start by checking the serving size on the Nutrition Facts label of packaged foods and drinks. The number of calories and the amount of carbs, fats, and other nutrients listed on the label are based on one serving of the item. Many items contain more than one serving per package. Check the total grams (g) of carbs in one serving. Check the number of grams of saturated fats and trans fats in one serving. Choose foods that have a low amount or none of these fats. Check the number of milligrams (mg) of salt (sodium) in one serving. Most people should limit total sodium intake to less than 2,300 mg per day. Always check the nutrition information of foods labeled as "low-fat" or "nonfat." These foods may be higher in added sugar or refined carbs and should be avoided. Talk  to your dietitian to identify your daily goals for nutrients listed on the label. Shopping Avoid buying canned, pre-made, or processed foods. These foods tend to be high in fat, sodium, and added sugar. Shop around the outside edge of the grocery store. This is where you will most often find fresh fruits and vegetables, bulk grains, fresh meats, and fresh dairy products. Cooking Use low-heat cooking methods, such as baking, instead of high-heat cooking methods, such as deep frying. Cook  using healthy oils, such as olive, canola, or sunflower oil. Avoid cooking with butter, cream, or high-fat meats. Meal planning Eat meals and snacks regularly, preferably at the same times every day. Avoid going long periods of time without eating. Eat foods that are high in fiber, such as fresh fruits, vegetables, beans, and whole grains. Eat 4-6 oz (112-168 g) of lean protein each day, such as lean meat, chicken, fish, eggs, or tofu. One ounce (oz) (28 g) of lean protein is equal to: 1 oz (28 g) of meat, chicken, or fish. 1 egg.  cup (62 g) of tofu. Eat some foods each day that contain healthy fats, such as avocado, nuts, seeds, and fish. What foods should I eat? Fruits Berries. Apples. Oranges. Peaches. Apricots. Plums. Grapes. Mangoes. Papayas. Pomegranates. Kiwi. Cherries. Vegetables Leafy greens, including lettuce, spinach, kale, chard, collard greens, mustard greens, and cabbage. Beets. Cauliflower. Broccoli. Carrots. Green beans. Tomatoes. Peppers. Onions. Cucumbers. Brussels sprouts. Grains Whole grains, such as whole-wheat or whole-grain bread, crackers, tortillas, cereal, and pasta. Unsweetened oatmeal. Quinoa. Brown or wild rice. Meats and other proteins Seafood. Poultry without skin. Lean cuts of poultry and beef. Tofu. Nuts. Seeds. Dairy Low-fat or fat-free dairy products such as milk, yogurt, and cheese. The items listed above may not be a complete list of foods and beverages you can eat and drink. Contact a dietitian for more information. What foods should I avoid? Fruits Fruits canned with syrup. Vegetables Canned vegetables. Frozen vegetables with butter or cream sauce. Grains Refined white flour and flour products such as bread, pasta, snack foods, and cereals. Avoid all processed foods. Meats and other proteins Fatty cuts of meat. Poultry with skin. Breaded or fried meats. Processed meat. Avoid saturated fats. Dairy Full-fat yogurt, cheese, or  milk. Beverages Sweetened drinks, such as soda or iced tea. The items listed above may not be a complete list of foods and beverages you should avoid. Contact a dietitian for more information. Questions to ask a health care provider Do I need to meet with a certified diabetes care and education specialist? Do I need to meet with a dietitian? What number can I call if I have questions? When are the best times to check my blood glucose? Where to find more information: American Diabetes Association: diabetes.org Academy of Nutrition and Dietetics: eatright.[employer] of Diabetes and Digestive and Kidney Diseases: AmenCredit.is Association of Diabetes Care & Education Specialists: diabeteseducator.org Summary It is important to have healthy eating habits because your blood sugar (glucose) levels are greatly affected by what you eat and drink. It is important to use alcohol carefully. A healthy meal plan will help you manage your blood glucose and lower your risk of heart disease. Your health care provider may recommend that you work with a dietitian to make a meal plan that is best for you. This information is not intended to replace advice given to you by your health care provider. Make sure you discuss any questions you have with your health care provider. Document Revised:  02/03/2020 Document Reviewed: 02/03/2020 Elsevier Patient Education  [place].

## 2022-08-27 NOTE — Assessment & Plan Note (Signed)
A1c is greater than 14 today here in the clinic.  He does not want to use injectable medication at this time.  Adding combination of metformin and Rybelsus.  Given 30-day sample of 3 mg Rybelsus and he will increase to 7 mg after completion of sample pack.  We discussed dietary changes to help with control his blood sugar.  Sent in prescription for glucometer.

## 2022-08-27 NOTE — Progress Notes (Signed)
James Mayer - 42 y.o. male MRN [id number]  Date of birth: 11-23-1980  Subjective Chief Complaint  Patient presents with   Follow-up    HPI James Mayer is a 42 y.o. male here today for follow up of elevated glucose.  Blood sugar noted to be 450 on recent labs, history of prediabetes with A1c of 6.0% about 3 years ago.  He has  had symptoms including increased thirst and fatigue.Marland Kitchen  Has had some weight loss as well.Marland Kitchen  He is not currently on medications to for blood sugar management.    Continues to have stress related to work as well as his separation.  Remains on Lexapro and BuSpar.  Feels that these continue to work pretty well for him.  Recently seen by rheumatology and diagnosed with psoriatic arthritis.  He will be starting on Taltz.  He has placed  ROS:  A comprehensive ROS was completed and negative except as noted per HPI      No data to display           No Known Allergies  Past Medical History:  Diagnosis Date   Hypertension    Low back pain     History reviewed. No pertinent surgical history.  Social History   Socioeconomic History   Marital status: Married    Spouse name: Abbi   Number of children: 4   Years of education: Not on file   Highest education level: Some college, no degree  Occupational History   Not on file  Tobacco Use   Smoking status: Never    Passive exposure: Past   Smokeless tobacco: Never  Vaping Use   Vaping Use: Never used  Substance and Sexual Activity   Alcohol use: Yes    Alcohol/week: 1.0 standard drink of alcohol    Types: 1 Shots of liquor per week    Comment: rarely   Drug use: No   Sexual activity: Yes    Birth control/protection: None  Other Topics Concern   Not on file  Social History Narrative   Live w wife and 2 daughter full time and 2 other children 60% of the time   Right handed   Caffeine: decaf tea and coffee   Social Determinants of Health   Financial Resource Strain: Not on file  Food  Insecurity: Not on file  Transportation Needs: Not on file  Physical Activity: Not on file  Stress: Not on file  Social Connections: Not on file    Family History  Problem Relation Age of Onset   Hypertension Father    Hyperlipidemia Father    Diabetes Sister    Diabetes Maternal Grandmother    Cancer Maternal Grandmother    Cancer Maternal Grandfather    Diabetes Paternal Grandmother    Neurologic Disorder Paternal Grandfather     Health Maintenance  Topic Date Due   FOOT EXAM  Never done   OPHTHALMOLOGY EXAM  Never done   COVID-19 Vaccine (3 - Moderna risk series) 09/12/2022 (Originally 03/29/2020)   INFLUENZA VACCINE  10/14/2022 (Originally 02/13/2022)   HIV Screening  12/28/2022 (Originally 05/03/1998)   HEMOGLOBIN A1C  02/25/2023   Diabetic kidney evaluation - eGFR measurement  07/18/2023   Diabetic kidney evaluation - Urine ACR  08/28/2023   DTaP/Tdap/Td (3 - Td or Tdap) 09/29/2026   Hepatitis C Screening  Completed   HPV VACCINES  Aged Out     ----------------------------------------------------------------------------------------------------------------------------------------------------------------------------------------------------------------- Physical Exam BP 118/71 (BP Location: Left Arm, Patient Position: Sitting, Cuff  Size: Normal)   Pulse 76   Ht 6' (1.829 m)   Wt 231 lb 0.6 oz (104.8 kg)   SpO2 99%   BMI 31.33 kg/m   Physical Exam Constitutional:      Appearance: Normal appearance.  HENT:     Head: Normocephalic and atraumatic.  Eyes:     General: No scleral icterus. Neurological:     Mental Status: He is alert.  Psychiatric:        Mood and Affect: Mood normal.        Behavior: Behavior normal.     ------------------------------------------------------------------------------------------------------------------------------------------------------------------------------------------------------------------- Assessment and Plan  Psoriatic  arthritis ([place]) Recent diagnosis.  Management rheumatology at this time.  Working on Academic librarian approved for him.  Anxiety disorder, unspecified Continue Lexapro and BuSpar at current strength.  Type 2 diabetes mellitus without complications (HCC) 123456 is greater than 14 today here in the clinic.  He does not want to use injectable medication at this time.  Adding combination of metformin and Rybelsus.  Given 30-day sample of 3 mg Rybelsus and he will increase to 7 mg after completion of sample pack.  We discussed dietary changes to help with control his blood sugar.  Sent in prescription for glucometer.   Meds ordered this encounter  Medications   Semaglutide (RYBELSUS) 3 MG TABS    Sig: Take 1 tablet (3 mg total) by mouth daily.    Dispense:  30 tablet    Refill:  0    Lot: [id number] Exp: 08/2023   Semaglutide (RYBELSUS) 7 MG TABS    Sig: Take 1 tablet (7 mg total) by mouth daily. Start after completion of 30 day 63m sample.    Dispense:  90 tablet    Refill:  1   metFORMIN (GLUCOPHAGE-XR) 500 MG 24 hr tablet    Sig: Take 1 tablet (500 mg total) by mouth daily with breakfast.    Dispense:  90 tablet    Refill:  1   Blood Glucose Monitoring Suppl DEVI    Sig: 1 each by Does not apply route daily. May substitute to any manufacturer covered by patient's insurance.    Dispense:  1 each    Refill:  0   Glucose Blood (BLOOD GLUCOSE TEST STRIPS) STRP    Sig: 1 each by In Vitro route in the morning, at noon, and at bedtime. May substitute to any manufacturer covered by patient's insurance.    Dispense:  100 strip    Refill:  0   Lancet Device MISC    Sig: 1 each by Does not apply route in the morning, at noon, and at bedtime. May substitute to any manufacturer covered by patient's insurance.    Dispense:  1 each    Refill:  0   Lancets Misc. MISC    Sig: 1 each by Does not apply route in the morning, at noon, and at bedtime. May substitute to any manufacturer covered by patient's  insurance.    Dispense:  100 each    Refill:  0    Return in about 3 months (around 11/25/2022) for T2DM.    This visit occurred during the SARS-CoV-2 public health emergency.  Safety protocols were in place, including screening questions prior to the visit, additional usage of staff PPE, and extensive cleaning of exam room while observing appropriate contact time as indicated for disinfecting solutions.

## 2022-08-27 NOTE — Assessment & Plan Note (Signed)
Continue Lexapro and BuSpar at current strength.

## 2022-08-27 NOTE — Assessment & Plan Note (Signed)
Recent diagnosis.  Management rheumatology at this time.  Working on Academic librarian approved for him.

## 2022-08-28 ENCOUNTER — Ambulatory Visit (INDEPENDENT_AMBULATORY_CARE_PROVIDER_SITE_OTHER): Payer: 59

## 2022-08-28 ENCOUNTER — Ambulatory Visit (INDEPENDENT_AMBULATORY_CARE_PROVIDER_SITE_OTHER): Payer: 59 | Admitting: Sports Medicine

## 2022-08-28 DIAGNOSIS — M75102 Unspecified rotator cuff tear or rupture of left shoulder, not specified as traumatic: Secondary | ICD-10-CM | POA: Insufficient documentation

## 2022-08-28 DIAGNOSIS — M75122 Complete rotator cuff tear or rupture of left shoulder, not specified as traumatic: Secondary | ICD-10-CM

## 2022-08-28 DIAGNOSIS — M25512 Pain in left shoulder: Secondary | ICD-10-CM | POA: Insufficient documentation

## 2022-08-28 MED ORDER — TRAMADOL HCL 50 MG PO TABS: 50.0000 mg | ORAL_TABLET | Freq: Two times a day (BID) | ORAL | 0 refills | Status: DC | PRN

## 2022-08-28 NOTE — Addendum Note (Signed)
Addended by: Silverio Decamp on: 08/28/2022 02:58 PM   Modules accepted: Orders

## 2022-08-28 NOTE — Progress Notes (Deleted)
Pharmacy Note  Subjective:   Patient presents to clinic today to receive first dose of Taltz.   Patient running a fever or have signs/symptoms of infection? {yes/no:20286}  Patient currently on antibiotics for the treatment of infection? {yes/no:20286}  Patient have any upcoming invasive procedures/surgeries? {yes/no:20286}  Objective: CMP     Component Value Date/Time   NA 131 (L) 07/17/2022 0938   NA 140 11/14/2017 0000   K 4.0 07/17/2022 0938   CL 91 (L) 07/17/2022 0938   CO2 25 07/17/2022 0938   GLUCOSE 450 (H) 07/17/2022 0938   BUN 20 07/17/2022 0938   BUN 10 11/14/2017 0000   CREATININE 1.43 (H) 07/17/2022 0938   CALCIUM 10.6 (H) 07/17/2022 0938   PROT 7.4 07/17/2022 0938   ALBUMIN 4.5 07/31/2019 1334   AST 54 (H) 07/17/2022 0938   ALT 91 (H) 07/17/2022 0938   ALKPHOS 64 07/31/2019 1334   BILITOT 1.1 07/17/2022 0938   GFRNONAA >60 10/16/2019 0123   GFRAA >60 10/16/2019 0123    CBC    Component Value Date/Time   WBC 7.0 07/17/2022 0938   RBC 5.39 07/17/2022 0938   HGB 15.2 07/17/2022 0938   HCT 44.3 07/17/2022 0938   PLT 325 07/17/2022 0938   MCV 82.2 07/17/2022 0938   MCH 28.2 07/17/2022 0938   MCHC 34.2 07/17/2022 0938   RDW 13.3 07/17/2022 0938   LYMPHSABS 1,498 07/17/2022 0938   MONOABS 0.4 10/16/2019 0123   EOSABS 140 07/17/2022 0938   BASOSABS 91 07/17/2022 0938    Baseline Immunosuppressant Therapy Labs TB GOLD    Latest Ref Rng & Units 07/17/2022    9:38 AM  Quantiferon TB Gold  Quantiferon TB Gold Plus NEGATIVE NEGATIVE    Hepatitis Panel    Latest Ref Rng & Units 07/17/2022    9:38 AM  Hepatitis  Hep B Surface Ag NON-REACTIVE NON-REACTIVE   Hep B IgM NON-REACTIVE NON-REACTIVE   Hep C Ab NON-REACTIVE NON-REACTIVE    HIV No results found for: "HIV" Immunoglobulins   SPEP    Latest Ref Rng & Units 07/17/2022    9:38 AM  Serum Protein Electrophoresis  Total Protein 6.1 - 8.1 g/dL 7.4    G6PD No results found for: "G6PDH" TPMT No  results found for: "TPMT"    Assessment/Plan:  Reviewed importance of holding Taltz with signs/symptoms of an infections, if antibiotics are prescribed to treat an active infection, and with invasive procedures  Demonstrated proper injection technique with Taltz demo device  Patient able to demonstrate proper injection technique using the teach back method.  Patient self injected in the {injsitedsg:28167} with:  Sample Medication: *** NDC: *** Lot: *** Expiration: ***  Patient tolerated well.  Observed for 30 mins in office for adverse reaction and ***.   Patient is to return in 1 month for labs and 6-8 weeks for follow-up appointment.  Standing orders placed.   Taltz approved through insurance .   Rx sent to: [place]: [phone].  Patient provided with pharmacy phone number and advised to call later this week to schedule shipment to home.  Patient will continue Taltz every 4 weeks.  All questions encouraged and answered.  Instructed patient to call with any further questions or concerns.  Maryan Puls, PharmD PGY-1 [facility] Resident

## 2022-08-28 NOTE — Assessment & Plan Note (Addendum)
This is a very pleasant 42 year old male, for the past 3 and half to 4 months he has had pain and weakness of the left shoulder, he localizes the discomfort directly over the deltoid just distal to the acromion. He has had a significant workup including steroids, gabapentin, he had a cervical spine MRI, and an appointment with a neurosurgeon. He presents today for further advice, on exam he has great difficulty abducting his left shoulder, he has positive impingement signs, he has a positive empty can test with severe weakness, he also has weakness to external rotation. He has good passive motion to external rotation and abduction ruling out frozen shoulder. Shoulder flexion is weak, resisted internal rotation is strong. With the above findings I do suspect his primary problem is a full-thickness supraspinatus and potentially infraspinatus tear, I also suspect some pathology in the long head of the biceps. Second of the differential would be Parsonage-Turner syndrome. To fully determine this we need x-rays and an MRI of his left shoulder, he will continue his diclofenac 75 twice a day, I am increasing his tramadol to 50 mg twice a day. He will discontinue gabapentin, he feels somewhat hung over in the morning after taking it and reports that it is not helping his shoulder pain all that much. Return to see me to go over MRI results.

## 2022-08-28 NOTE — Progress Notes (Signed)
    Procedures performed today:    None.  Independent interpretation of notes and tests performed by another provider:   None.  Brief History, Exam, Impression, and Recommendations:    Rotator cuff tear, left This is a very pleasant 42 year old male, for the past 3 and half to 4 months he has had pain and weakness of the left shoulder, he localizes the discomfort directly over the deltoid just distal to the acromion. He has had a significant workup including steroids, gabapentin, he had a cervical spine MRI, and an appointment with a neurosurgeon. He presents today for further advice, on exam he has great difficulty abducting his left shoulder, he has positive impingement signs, he has a positive empty can test with severe weakness, he also has weakness to external rotation. He has good passive motion to external rotation and abduction ruling out frozen shoulder. Shoulder flexion is weak, resisted internal rotation is strong. With the above findings I do suspect his primary problem is a full-thickness supraspinatus and potentially infraspinatus tear, I also suspect some pathology in the long head of the biceps. Second of the differential would be Parsonage-Turner syndrome. To fully determine this we need x-rays and an MRI of his left shoulder, he will continue his diclofenac 75 twice a day, I am increasing his tramadol to 50 mg twice a day. He will discontinue gabapentin, he feels somewhat hung over in the morning after taking it and reports that it is not helping his shoulder pain all that much. Return to see me to go over MRI results.  I spent 30 minutes of total time managing this patient today, this includes chart review, face to face, and non-face to face time.  ____________________________________________ Gwen Her. Dianah Field, M.D., ABFM., CAQSM., AME. Primary Care and Sports Medicine [facility]  Adjunct Professor of [employer]  Risk manager

## 2022-08-29 ENCOUNTER — Ambulatory Visit: Payer: No Typology Code available for payment source | Admitting: Pharmacist

## 2022-08-29 NOTE — Telephone Encounter (Signed)
Patient no-show to Donnetta Hail new start appt today.  Will have to call pt to r/s  Knox Saliva, PharmD, MPH, BCPS, CPP Clinical Pharmacist (Rheumatology and Pulmonology)

## 2022-08-30 NOTE — Telephone Encounter (Signed)
ATC patient to re-schedule Taltz new start. Left VM requesting return call. Patient can be scheduled for any open slot if he returns call.  Knox Saliva, PharmD, MPH, BCPS, CPP Clinical Pharmacist (Rheumatology and Pulmonology)

## 2022-09-01 ENCOUNTER — Ambulatory Visit (INDEPENDENT_AMBULATORY_CARE_PROVIDER_SITE_OTHER): Payer: 59

## 2022-09-01 DIAGNOSIS — M75122 Complete rotator cuff tear or rupture of left shoulder, not specified as traumatic: Secondary | ICD-10-CM | POA: Diagnosis not present

## 2022-09-03 NOTE — Telephone Encounter (Signed)
Taltz new start r/s for 09/06/2022  Knox Saliva, PharmD, MPH, BCPS, CPP Clinical Pharmacist (Rheumatology and Pulmonology)

## 2022-09-06 ENCOUNTER — Ambulatory Visit: Payer: No Typology Code available for payment source | Admitting: Pharmacist

## 2022-09-06 NOTE — Telephone Encounter (Signed)
Patient rescheduled James Mayer new start for 09/11/2022  James Mayer, PharmD, MPH, BCPS, CPP Clinical Pharmacist (Rheumatology and Pulmonology)

## 2022-09-11 ENCOUNTER — Ambulatory Visit: Payer: No Typology Code available for payment source | Attending: Internal Medicine | Admitting: Pharmacist

## 2022-09-11 DIAGNOSIS — Z7189 Other specified counseling: Secondary | ICD-10-CM

## 2022-09-11 DIAGNOSIS — L4050 Arthropathic psoriasis, unspecified: Secondary | ICD-10-CM

## 2022-09-11 DIAGNOSIS — Z79899 Other long term (current) drug therapy: Secondary | ICD-10-CM

## 2022-09-11 MED ORDER — TALTZ 80 MG/ML ~~LOC~~ SOAJ: 80.0000 mg | SUBCUTANEOUS | 1 refills | Status: DC

## 2022-09-11 NOTE — Patient Instructions (Addendum)
Your next TALTZ dose is due on 10/09/22, 11/06/22, and every 4 weeks thereafter. YOU WILL ONLY USE ONE PEN MOVING FORWARD  CONTINUE diclofenac twice daily as needed  HOLD TALTZ if you have signs or symptoms of an infection. You can resume once you feel better or back to your baseline. HOLD TALTZ if you start antibiotics to treat an infection. HOLD TALTZ around the time of surgery/procedures. Your surgeon will be able to provide recommendations on when to hold BEFORE and when you are cleared to [place].  Pharmacy information: Your prescription will be shipped from [facility]. Their phone number is [phone] Please call to schedule shipment and confirm address. They will mail your medication to your home.  Cost information: Your copay should be affordable. If you call the pharmacy and it is not affordable, please double-check that they are billing through your copay card as secondary coverage. That copay card information is: BIN: [id number] PCN: [id number] GRP: [id number] ID: [id number] Expiration Date: 07/15/2025  Labs are due in 1 month then every 3 months. Lab hours are from Monday to Thursday 8am-12:30pm and 1pm-5pm and Friday 8am-12pm. You do not need an appointment if you come for labs during these times.  How to manage an injection site reaction: Remember the 5 C's: COUNTER - leave on the counter at least 30 minutes but up to overnight to bring medication to room temperature. This may help prevent stinging COLD - place something cold (like an ice gel pack or cold water bottle) on the injection site just before cleansing with alcohol. This may help reduce pain CLARITIN - use Claritin (generic name is loratadine) for the first two weeks of treatment or the day of, the day before, and the day after injecting. This will help to minimize injection site reactions CORTISONE CREAM - apply if injection site is irritated and itching CALL ME - if injection site reaction is bigger than  the size of your fist, looks infected, blisters, or if you develop hives

## 2022-09-11 NOTE — Progress Notes (Signed)
Pharmacy Note  Subjective:   Patient presents to clinic today to receive first dose of TALTZ for psoriatic arthritis. The plan was to start patient on Cosentyx but insurance preferred option is Cosentyx. He is naive to injectable medications. He is currently taking diclofenac '75mg'$  twice daily  Patient running a fever or have signs/symptoms of infection? No  Patient currently on antibiotics for the treatment of infection? No  Patient have any upcoming invasive procedures/surgeries? No - states he may in near future but nothing currently scheduled. He states that he has recently been told he left rotator cuff tear.  Objective: CMP     Component Value Date/Time   NA 131 (L) 07/17/2022 0938   NA 140 11/14/2017 0000   K 4.0 07/17/2022 0938   CL 91 (L) 07/17/2022 0938   CO2 25 07/17/2022 0938   GLUCOSE 450 (H) 07/17/2022 0938   BUN 20 07/17/2022 0938   BUN 10 11/14/2017 0000   CREATININE 1.43 (H) 07/17/2022 0938   CALCIUM 10.6 (H) 07/17/2022 0938   PROT 7.4 07/17/2022 0938   ALBUMIN 4.5 07/31/2019 1334   AST 54 (H) 07/17/2022 0938   ALT 91 (H) 07/17/2022 0938   ALKPHOS 64 07/31/2019 1334   BILITOT 1.1 07/17/2022 0938   GFRNONAA >60 10/16/2019 0123   GFRAA >60 10/16/2019 0123    CBC    Component Value Date/Time   WBC 7.0 07/17/2022 0938   RBC 5.39 07/17/2022 0938   HGB 15.2 07/17/2022 0938   HCT 44.3 07/17/2022 0938   PLT 325 07/17/2022 0938   MCV 82.2 07/17/2022 0938   MCH 28.2 07/17/2022 0938   MCHC 34.2 07/17/2022 0938   RDW 13.3 07/17/2022 0938   LYMPHSABS 1,498 07/17/2022 0938   MONOABS 0.4 10/16/2019 0123   EOSABS 140 07/17/2022 0938   BASOSABS 91 07/17/2022 0938    Baseline Immunosuppressant Therapy Labs TB GOLD    Latest Ref Rng & Units 07/17/2022    9:38 AM  Quantiferon TB Gold  Quantiferon TB Gold Plus NEGATIVE NEGATIVE    Hepatitis Panel    Latest Ref Rng & Units 07/17/2022    9:38 AM  Hepatitis  Hep B Surface Ag NON-REACTIVE NON-REACTIVE   Hep B IgM  NON-REACTIVE NON-REACTIVE   Hep C Ab NON-REACTIVE NON-REACTIVE    HIV No results found for: "HIV" Immunoglobulins   SPEP    Latest Ref Rng & Units 07/17/2022    9:38 AM  Serum Protein Electrophoresis  Total Protein 6.1 - 8.1 g/dL 7.4    Chest x-ray: 10/16/2019 - Vague patchy opacities throughout both lungs consistent with the given clinical history  Assessment/Plan:   Counseled patient that Donnetta Hail is a IL-17 inhibitor that works to reduce pain and inflammation associated with arthritis.  Counseled patient on purpose, proper use, and adverse effects of Taltz. Reviewed the most common adverse effects of infection (more commonly nasopharyngitis, URTI), inflammatory bowel disease, and allergic reaction. Counseled patient that Donnetta Hail should be held for infection and prior to scheduled surgery.  Counseled patient to avoid live vaccines while on Taltz. Recommend annual influenza, PCV 15 or PCV20 or Pneumovax 23, and Shingrix as indicated. Reviewed storage information for Taltz.  Reviewed the importance of regular labs while on [place]. Will monitor CBC and CMP 1 month after starting and every 3 months routinely thereafter. Will monitor TB gold annually. Standing orders placed. Provided patient with medication education material and answered all questions.  Patient consented to [place].  Will upload consent into patient's chart.  Patient voiced understanding.    Reviewed importance of holding TALTZ with signs/symptoms of an infections, if antibiotics are prescribed to treat an active infection, and with invasive procedures  Demonstrated proper injection technique with TALTZ demo device  Patient able to demonstrate proper injection technique using the teach back method.  Patient self injected in the right upper thigh and left upper thigh with:  Sample Medication: TALTZ '80mg'$ /ml pen x 2 pens = total dose of '160mg'$  Lot: [id number] Expiration: 07/18/2023  Sample Medication: TALTZ '80mg'$ /ml pen x 2 pens = total  dose of '160mg'$  Lot: [id number] Expiration: 02/16/2024  Patient tolerated well.  Observed for 30 mins in office for adverse reaction and none noted. Patient denies itchiness and irritation. No redness or swelling notes at injection site   Patient is to return in 1 month for labs and 6-8 weeks for follow-up appointment.  Standing orders for CBC and CMP placed.   TALTZ approved through insurance .   Rx sent to: [place]: [phone].  Patient provided with pharmacy phone number and advised to call later this week to schedule shipment to home.  Patient will continue TALTZ '160mg'$  SQ at Week 0 (completed in clinic today as two divided injections) then '80mg'$  every 4 weeks thereafter.  He will continue diclofenac '75mg'$  twice daily as needed  All questions encouraged and answered.  Instructed patient to call with any further questions or concerns.  Knox Saliva, PharmD, MPH, BCPS, CPP Clinical Pharmacist (Rheumatology and Pulmonology)  09/11/2022 8:30 AM

## 2022-09-11 NOTE — Telephone Encounter (Signed)
[place] card: Doreen SalvageEA:[id number] PCNChrist [id number] Expiration Date: 07/15/2025  Knox Saliva, PharmD, MPH, BCPS, CPP Clinical Pharmacist (Rheumatology and Pulmonology)

## 2022-10-22 NOTE — Progress Notes (Deleted)
Office Visit Note  Patient: James Mayer             Date of Birth: 1980/10/25           MRN: [id number]             PCP: Everrett Coombe, DO Referring: Everrett Coombe, DO Visit Date: 10/23/2022   Subjective:  No chief complaint on file.   History of Present Illness: James Mayer is a 42 y.o. male here for follow up ***   Previous HPI 08/14/22  James Mayer is a 42 y.o. male here for follow up for psoriatic arthritis.  Initial evaluation demonstrated elevated serum inflammatory markers with some inflammatory appearing peripheral joint pain and digital pitting changes.  Lab findings also had markedly elevated serum glucose he has been scheduled follow-up with his PCP office in about 2 weeks for further evaluation.  Liver function test showed mild elevations in AST and ALT.  He had some exacerbation of joint pains especially in his back and bilateral hands with an increase in swelling and difficulty closing his grip and took another course of oral prednisone starting January 12 for this which did improve symptoms again.  He is also scheduled for nerve conduction study based on his cervical spine disease with suspected impingement or radiculopathy and plans to follow-up further with his neurosurgeon regarding these results.   Previous HPI 07/17/22 James Mayer is a 42 y.o. male  here for evaluation of joint pains and elevated CRP. He has ongoing neck and back pain and [facility] showed severe left neural foraminal narrowing at C4-C5 and C5-C6 along with severe neural foraminal narrowing 2/2 to large left subarticular disc protrusion at C7-T1. Also with bilateral hand pain and intermittent swelling and mild xray changes concerning for possible RA. Overall a lot of his joint pains have been going on for years with some gradual progression over time.  He has taken meloxicam primarily prescribed for his back with some loss of efficacy since switching  to diclofenac he is seeing more improvement in symptoms.  He does definitely notice worsening of symptoms if he misses any of the twice daily dosing.  Biggest active problems right now in the neck back hands and feet.  He sometimes sees swelling of an entire finger joint at a time most recently the left third finger has been worst affected.  He has trouble tightly gripping and feels some decrease in grip strength.  He has noticed ulnar deviation and inability to fully close the second and third finger affecting both hands.  Pain is worse after heavy use or activity but also experiences pain at night and early morning if he misses any doses of the diclofenac. He experiences some skin rashes which have also been longstanding occurring on the flexor surfaces of his elbows and skin fold on the chest.  Reports dermatologist had expressed a concern for inverse pityriasis rosea but no topical treatment helped significantly, although also does not hurt or itch.  He has some pitting affecting fingernails on both hands.     Labs reviewed ESR 22 CRP 18.0 Uric acid 4.9   04/11/22 Xray right hand IMPRESSION: 1. Mild ulnar deviation of the second and third digits at the metacarpophalangeal joints. This is nonspecific, but may be an early manifestation of inflammatory arthropathy such as rheumatoid. 2. There is trace associated spurring of the second and third metacarpal phalangeal joints.   04/11/22 Xray left  hand IMPRESSION: Possible slight ulnar subluxation of the thumb proximal phalanx at the metacarpophalangeal joint, nonspecific. Trace radiocarpal, typically degenerative. No specific findings to suggest inflammatory arthropathy.   No Rheumatology ROS completed.   PMFS History:  Patient Active Problem List   Diagnosis Date Noted   Rotator cuff tear, left 08/28/2022   Impulsiveness 08/27/2022   Paresthesia of skin 08/27/2022   Type 2 diabetes mellitus without complications 08/27/2022   Psoriatic  arthritis 08/14/2022   High risk medication use 08/14/2022   Bilateral hand pain 07/17/2022   Cervical spinal stenosis 06/11/2022   Serous otitis media 09/27/2021   Tinnitus, bilateral 02/06/2021   Anxiety disorder, unspecified 02/06/2021   Conduct disorder, unspecified 02/06/2021   Restless legs 01/15/2021   Neck pain 08/22/2020   Idiopathic peripheral neuropathy 08/22/2020   Cervical spondylosis 08/22/2020   Bronchitis 08/22/2020   Acute low back pain 01/27/2020   Acute left ankle pain 08/28/2019   Polydipsia 07/31/2019   Arthralgia 04/14/2019   Essential hypertension 08/26/2018   Chronic low back pain 08/26/2018    Past Medical History:  Diagnosis Date   Hypertension    Low back pain    Type 2 diabetes mellitus without complications (HCC)     Family History  Problem Relation Age of Onset   Hypertension Father    Hyperlipidemia Father    Diabetes Sister    Diabetes Maternal Grandmother    Cancer Maternal Grandmother    Cancer Maternal Grandfather    Diabetes Paternal Grandmother    Neurologic Disorder Paternal Grandfather    No past surgical history on file. Social History   Social History Narrative   Live w wife and 2 daughter full time and 2 other children 60% of the time   Right handed   Caffeine: decaf tea and coffee   Immunization History  Administered Date(s) Administered   Influenza, Seasonal, Injecte, Preservative Fre 07/15/2015   Moderna Sars-Covid-2 Vaccination 02/01/2020, 03/01/2020   Td (Adult) 09/28/2016   Tdap 07/16/2006     Objective: Vital Signs: There were no vitals taken for this visit.   Physical Exam   Musculoskeletal Exam: ***  CDAI Exam: CDAI Score: -- Patient Global: --; Provider Global: -- Swollen: --; Tender: -- Joint Exam 10/23/2022   No joint exam has been documented for this visit   There is currently no information documented on the homunculus. Go to the Rheumatology activity and complete the homunculus joint  exam.  Investigation: No additional findings.  Imaging: No results found.  Recent Labs: Lab Results  Component Value Date   WBC 7.0 07/17/2022   HGB 15.2 07/17/2022   PLT 325 07/17/2022   NA 131 (L) 07/17/2022   K 4.0 07/17/2022   CL 91 (L) 07/17/2022   CO2 25 07/17/2022   GLUCOSE 450 (H) 07/17/2022   BUN 20 07/17/2022   CREATININE 1.43 (H) 07/17/2022   BILITOT 1.1 07/17/2022   ALKPHOS 64 07/31/2019   AST 54 (H) 07/17/2022   ALT 91 (H) 07/17/2022   PROT 7.4 07/17/2022   ALBUMIN 4.5 07/31/2019   CALCIUM 10.6 (H) 07/17/2022   GFRAA >60 10/16/2019   QFTBGOLDPLUS NEGATIVE 07/17/2022    Speciality Comments: No specialty comments available.  Procedures:  No procedures performed Allergies: Patient has no known allergies.   Assessment / Plan:     Visit Diagnoses: No diagnosis found.  ***  Orders: No orders of the defined types were placed in this encounter.  No orders of the defined types were placed in  this encounter.    Follow-Up Instructions: No follow-ups on file.   Fuller Plan, MD  Note - This record has been created using [employer].  Chart creation errors have been sought, but may not always  have been located. Such creation errors do not reflect on  the standard of medical care.

## 2022-10-23 ENCOUNTER — Ambulatory Visit: Payer: No Typology Code available for payment source | Admitting: Internal Medicine

## 2022-10-29 ENCOUNTER — Encounter: Payer: Self-pay | Admitting: *Deleted

## 2022-10-31 ENCOUNTER — Other Ambulatory Visit: Payer: Self-pay | Admitting: Internal Medicine

## 2022-10-31 DIAGNOSIS — L4050 Arthropathic psoriasis, unspecified: Secondary | ICD-10-CM

## 2022-10-31 NOTE — Telephone Encounter (Signed)
Please schedule patient a follow up visit. Patient due 10/2022. Thanks!  

## 2022-10-31 NOTE — Telephone Encounter (Signed)
Last Fill: 08/14/2022  Labs: 07/17/2022 Glucose 450, Creat 1.43, Sodium 131, Chloride 91, Calcium 10.6, Alkaline Phosphatase 132, AST 54, ALT 91  Next Visit: Due 10/23/2022. Message sent to the front to schedule.   Last Visit: 08/14/2022  DX: Psoriatic arthritis   Current Dose per office note 08/14/2022: Not mentioned   Okay to refill Diclofenac?

## 2022-11-14 ENCOUNTER — Other Ambulatory Visit: Payer: Self-pay | Admitting: Family Medicine

## 2022-11-15 MED ORDER — TRAMADOL HCL 50 MG PO TABS: 50.0000 mg | ORAL_TABLET | Freq: Two times a day (BID) | ORAL | 0 refills | Status: DC | PRN

## 2022-11-26 ENCOUNTER — Encounter: Payer: Self-pay | Admitting: Family Medicine

## 2022-11-26 ENCOUNTER — Ambulatory Visit: Payer: 59 | Admitting: Family Medicine

## 2022-11-26 VITALS — BP 121/75 | HR 78 | Ht 72.0 in | Wt 234.0 lb

## 2022-11-26 DIAGNOSIS — F419 Anxiety disorder, unspecified: Secondary | ICD-10-CM | POA: Diagnosis not present

## 2022-11-26 DIAGNOSIS — E119 Type 2 diabetes mellitus without complications: Secondary | ICD-10-CM | POA: Diagnosis not present

## 2022-11-26 DIAGNOSIS — I10 Essential (primary) hypertension: Secondary | ICD-10-CM | POA: Diagnosis not present

## 2022-11-26 LAB — POCT GLYCOSYLATED HEMOGLOBIN (HGB A1C)
HbA1c, POC (controlled diabetic range): 13.1 % — AB (ref 0.0–7.0)
Hemoglobin A1C: 13.1 % — AB (ref 4.0–5.6)

## 2022-11-26 MED ORDER — RYBELSUS 7 MG PO TABS: 7.0000 mg | ORAL_TABLET | Freq: Every day | ORAL | 1 refills | Status: DC

## 2022-11-26 MED ORDER — RYBELSUS 3 MG PO TABS: 3.0000 mg | ORAL_TABLET | Freq: Every day | ORAL | 0 refills | Status: DC

## 2022-11-26 MED ORDER — TOUJEO SOLOSTAR 300 UNIT/ML ~~LOC~~ SOPN: PEN_INJECTOR | SUBCUTANEOUS | 3 refills | Status: DC

## 2022-11-26 MED ORDER — BD PEN NEEDLE NANO U/F 32G X 4 MM MISC: 0 refills | Status: DC

## 2022-11-26 MED ORDER — RYBELSUS 7 MG PO TABS
7.0000 mg | ORAL_TABLET | Freq: Every day | ORAL | 1 refills | Status: DC
Start: 2022-11-26 — End: 2023-02-26

## 2022-11-26 NOTE — Progress Notes (Signed)
No PA needed of Rybelsus per CMM.  [name] (Key: [id number]) Rybelsus 7MG  tablets Determination: Drug is covered by current benefit plan. No further PA activity needed  Spoke to [employer]. They are not in contact with patient's insurance. Contacted the patient for pharmacy preference [facility].  Pt requested [facility]. Contacted the pharmacy for medication pricing.  Rybelsus $14.99 x 1 month. Toujeo $62. Advised patient to access Toujeo discount card from website.  Pt agrees to treatment plan and pricing.

## 2022-11-26 NOTE — Assessment & Plan Note (Signed)
Improved some.  He'll continue on lexapro at current strength.

## 2022-11-26 NOTE — Assessment & Plan Note (Addendum)
Diabetes remains uncontrolled.  He is having symptoms related to his diabetes.  Dietary changes recommended.  Adding toujeo 20 units daily.  Restart Rybelsus, provided with coupon.

## 2022-11-26 NOTE — Progress Notes (Signed)
James Mayer - 42 y.o. male MRN [id number]  Date of birth: 09/21/1980  Subjective Chief Complaint  Patient presents with   Diabetes    HPI James Mayer is a 42 y.o. male here today for follow up visit.    He has started on metformin and is tolerating well.  He did start 3mg  Rybelsus sample but did not continue on the 7mg  as it was $3000. He does continue to have increased thirst and frequent urination. He does have fatigue as well.  His A1c has improved only slightly at 13.1%.  He is not very active and has not really made changes to his diet.    Stress is slightly better.  His wife has moved back in with him and they are trying to reconcile.  He still has work related stress. Lexapro remains pretty effective.   ROS:  A comprehensive ROS was completed and negative except as noted per HPI    No Known Allergies  Past Medical History:  Diagnosis Date   Hypertension    Low back pain    Type 2 diabetes mellitus without complications (HCC)     History reviewed. No pertinent surgical history.  Social History   Socioeconomic History   Marital status: Married    Spouse name: Abbi   Number of children: 4   Years of education: Not on file   Highest education level: Some college, no degree  Occupational History   Not on file  Tobacco Use   Smoking status: Never    Passive exposure: Past   Smokeless tobacco: Never  Vaping Use   Vaping Use: Never used  Substance and Sexual Activity   Alcohol use: Yes    Alcohol/week: 1.0 standard drink of alcohol    Types: 1 Shots of liquor per week    Comment: rarely   Drug use: No   Sexual activity: Yes    Birth control/protection: None  Other Topics Concern   Not on file  Social History Narrative   Live w wife and 2 daughter full time and 2 other children 60% of the time   Right handed   Caffeine: decaf tea and coffee   Social Determinants of Health   Financial Resource Strain: Not on file  Food Insecurity: Not on file   Transportation Needs: Not on file  Physical Activity: Not on file  Stress: Not on file  Social Connections: Not on file    Family History  Problem Relation Age of Onset   Hypertension Father    Hyperlipidemia Father    Diabetes Sister    Diabetes Maternal Grandmother    Cancer Maternal Grandmother    Cancer Maternal Grandfather    Diabetes Paternal Grandmother    Neurologic Disorder Paternal Grandfather     Health Maintenance  Topic Date Due   HIV Screening  12/28/2022 (Originally 05/03/1998)   OPHTHALMOLOGY EXAM  03/29/2023 (Originally 05/03/1993)   COVID-19 Vaccine (3 - Moderna risk series) 06/14/2023 (Originally 03/29/2020)   INFLUENZA VACCINE  02/14/2023   HEMOGLOBIN A1C  05/29/2023   Diabetic kidney evaluation - eGFR measurement  07/18/2023   Diabetic kidney evaluation - Urine ACR  08/28/2023   FOOT EXAM  11/26/2023   DTaP/Tdap/Td (3 - Td or Tdap) 09/29/2026   Hepatitis C Screening  Completed   HPV VACCINES  Aged Out     ----------------------------------------------------------------------------------------------------------------------------------------------------------------------------------------------------------------- Physical Exam BP 121/75 (BP Location: Left Arm, Patient Position: Sitting, Cuff Size: Large)   Pulse 78   Ht 6' (1.829  m)   Wt 234 lb (106.1 kg)   SpO2 97%   BMI 31.74 kg/m   Physical Exam Constitutional:      Appearance: Normal appearance.  HENT:     Head: Normocephalic and atraumatic.  Eyes:     General: No scleral icterus. Neurological:     General: No focal deficit present.     Mental Status: He is alert and oriented to person, place, and time.  Psychiatric:        Mood and Affect: Mood normal.        Behavior: Behavior normal.      ------------------------------------------------------------------------------------------------------------------------------------------------------------------------------------------------------------------- Assessment and Plan  Essential hypertension BP remains well controlled.  Continue lisinopril/hctz at current strength.    Type 2 diabetes mellitus without complications (HCC) Diabetes remains uncontrolled.  He is having symptoms related to his diabetes.  Dietary changes recommended.  Adding toujeo 20 units daily.  Restart Rybelsus, provided with coupon.   Anxiety disorder, unspecified Improved some.  He'll continue on lexapro at current strength.     Meds ordered this encounter  Medications   Semaglutide (RYBELSUS) 7 MG TABS    Sig: Take 1 tablet (7 mg total) by mouth daily. Start after completion of 30 day 3mg  sample.    Dispense:  90 tablet    Refill:  1   Semaglutide (RYBELSUS) 3 MG TABS    Sig: Take 1 tablet (3 mg total) by mouth daily.    Dispense:  30 tablet    Refill:  0    Lot: [id number] Exp: 10/2023   insulin glargine, 1 Unit Dial, (TOUJEO SOLOSTAR) 300 UNIT/ML Solostar Pen    Sig: Inject 20 units daily.    Dispense:  3 mL    Refill:  3   Insulin Pen Needle (BD PEN NEEDLE NANO U/F) 32G X 4 MM MISC    Sig: Use to inject insulin daily.    Dispense:  100 each    Refill:  0    Return in about 3 months (around 02/26/2023) for F/u T2DM.    This visit occurred during the SARS-CoV-2 public health emergency.  Safety protocols were in place, including screening questions prior to the visit, additional usage of staff PPE, and extensive cleaning of exam room while observing appropriate contact time as indicated for disinfecting solutions.

## 2022-11-26 NOTE — Patient Instructions (Addendum)
Start toujeo 20 units daily.  Restart rybelsus.  See me again in 3 months.

## 2022-11-26 NOTE — Assessment & Plan Note (Signed)
BP remains well controlled. Continue lisinopril/hctz at current strength.   ? ?

## 2022-12-05 ENCOUNTER — Other Ambulatory Visit: Payer: Self-pay | Admitting: Internal Medicine

## 2022-12-05 ENCOUNTER — Other Ambulatory Visit (INDEPENDENT_AMBULATORY_CARE_PROVIDER_SITE_OTHER): Payer: 59

## 2022-12-05 ENCOUNTER — Ambulatory Visit (INDEPENDENT_AMBULATORY_CARE_PROVIDER_SITE_OTHER): Payer: 59 | Admitting: Sports Medicine

## 2022-12-05 DIAGNOSIS — M75122 Complete rotator cuff tear or rupture of left shoulder, not specified as traumatic: Secondary | ICD-10-CM

## 2022-12-05 DIAGNOSIS — G8929 Other chronic pain: Secondary | ICD-10-CM

## 2022-12-05 DIAGNOSIS — M25512 Pain in left shoulder: Secondary | ICD-10-CM | POA: Diagnosis not present

## 2022-12-05 DIAGNOSIS — L4050 Arthropathic psoriasis, unspecified: Secondary | ICD-10-CM

## 2022-12-05 MED ORDER — TRIAMCINOLONE ACETONIDE 40 MG/ML IJ SUSP
40.0000 mg | Freq: Once | INTRAMUSCULAR | Status: AC
Start: 2022-12-05 — End: 2022-12-05
  Administered 2022-12-05: 40 mg via INTRAMUSCULAR

## 2022-12-05 NOTE — Progress Notes (Signed)
    Procedures performed today:    Procedure: Real-time Ultrasound Guided injection of the left glenohumeral joint Device: Samsung HS60  Verbal informed consent obtained.  Time-out conducted.  Noted no overlying erythema, induration, or other signs of local infection.  Skin prepped in a sterile fashion.  Local anesthesia: Topical Ethyl chloride.  With sterile technique and under real time ultrasound guidance: Noted trace effusion, 1 cc Kenalog 40, 2 cc lidocaine, 2 cc bupivacaine injected easily Completed without difficulty  Advised to call if fevers/chills, erythema, induration, drainage, or persistent bleeding.  Images permanently stored and available for review in PACS.  Impression: Technically successful ultrasound guided injection.  Independent interpretation of notes and tests performed by another provider:   None.  Brief History, Exam, Impression, and Recommendations:    Left shoulder pain James Mayer is a very pleasant 42 year old male, he has a fairly long history of pain and weakness left shoulder particularly to abduction. This has been going on for probably 8 to 9 months now. He has had workup including steroids, gabapentin, cervical spine MRI with severe left-sided C5-6 and C6-7 foraminal stenosis. She has worked with a Midwife. On exam at the last visit in February with me he had some positive impingement signs, severe weakness, I was concerned about a cuff tear, we obtained a shoulder MRI that did not show any rotator cuff tearing but did show cuff tendinosis as well as labral tearing.   There is no evidence of Parsonage-Turner syndrome on the MRI. It sounds like he had a nerve conduction study as well that showed some left sided radiculopathy, although I do not have these results. Today his exam findings included a positive O'Brien sign, I did a glenohumeral injection. He understands that the differential continues to be broad and that his severe weakness may still be  related to a C6 radiculopathy. I would like to see him back in about a month or 6 weeks after this injection and if insufficient improvement we need to look further into his C6 radiculopathy and potentially consider cervical epidural versus decompressive surgery.    ____________________________________________ James Mayer. James Mayer, M.D., ABFM., CAQSM., AME. Primary Care and Sports Medicine [facility]  Adjunct Professor of Family Medicine  [place] [employer] [facility] [employer]  Restaurant manager, fast food

## 2022-12-05 NOTE — Assessment & Plan Note (Signed)
James Mayer is a very pleasant 42 year old male, he has a fairly long history of pain and weakness left shoulder particularly to abduction. This has been going on for probably 8 to 9 months now. He has had workup including steroids, gabapentin, cervical spine MRI with severe left-sided C5-6 and C6-7 foraminal stenosis. She has worked with a Midwife. On exam at the last visit in February with me he had some positive impingement signs, severe weakness, I was concerned about a cuff tear, we obtained a shoulder MRI that did not show any rotator cuff tearing but did show cuff tendinosis as well as labral tearing.   There is no evidence of Parsonage-Turner syndrome on the MRI. It sounds like he had a nerve conduction study as well that showed some left sided radiculopathy, although I do not have these results. Today his exam findings included a positive O'Brien sign, I did a glenohumeral injection. He understands that the differential continues to be broad and that his severe weakness may still be related to a C6 radiculopathy. I would like to see him back in about a month or 6 weeks after this injection and if insufficient improvement we need to look further into his C6 radiculopathy and potentially consider cervical epidural versus decompressive surgery.

## 2022-12-05 NOTE — Addendum Note (Signed)
Addended by: Carren Rang A on: 12/05/2022 10:30 AM   Modules accepted: Orders

## 2023-01-13 ENCOUNTER — Other Ambulatory Visit: Payer: Self-pay | Admitting: Sports Medicine

## 2023-01-16 ENCOUNTER — Ambulatory Visit (INDEPENDENT_AMBULATORY_CARE_PROVIDER_SITE_OTHER): Payer: 59 | Admitting: Sports Medicine

## 2023-01-16 ENCOUNTER — Telehealth: Payer: Self-pay

## 2023-01-16 DIAGNOSIS — M25512 Pain in left shoulder: Secondary | ICD-10-CM

## 2023-01-16 DIAGNOSIS — G8929 Other chronic pain: Secondary | ICD-10-CM | POA: Diagnosis not present

## 2023-01-16 NOTE — Assessment & Plan Note (Signed)
James Mayer returns, he is a very pleasant 42 year old male with a fairly long history of pain and weakness left shoulder particularly to abduction, going on 8 to 9 months. He has had extensive workup including steroids, gabapentin, he had a C-spine MRI with severe left-sided C5-C6 and C6-C7 foraminal stenosis, he has also been working with Dr. Marikay Alar with neurosurgery. I saw him in February and he had some positive impingement signs, severe weakness and I was concerned about a cuff tear, we obtained a shoulder MRI that did not show any rotator cuff tearing, he did have some cuff tendinosis, labral tearing and no evidence of Parsonage-Turner syndrome. Sounds like he also had a nerve conduction study that showed some left-sided cervical radiculopathy though I do not have these results. At the last visit in May exam findings included a positive O'Brien sign, we did a glenohumeral joint injection. He did have some improvement in range of motion, only minimal improvement in strength. He is for the most part pain-free now. He does understand that the differential is broad, and the weakness may still be related to a C6 radiculopathy. As his pain is gone and his motion has improved, I do not think he is a candidate for any surgical intervention. I have advised to continue with aggressive home cervical spondylosis conditioning and home rotator cuff conditioning for at least 2 to 3 months. I would like to see him back in 2 to 3 months, and we can determine which direction to go should he still have any complaints. If he still has significant weakness then I would suggest he follow back up with Dr. Yetta Barre for discussion of ACDF.  If he only has pain and I suspect it is coming from a radicular origin we will set him up for a cervical epidural.

## 2023-01-16 NOTE — Progress Notes (Signed)
    Procedures performed today:    None.  Independent interpretation of notes and tests performed by another provider:   None.  Brief History, Exam, Impression, and Recommendations:    Left shoulder pain James Mayer returns, he is a very pleasant 43 year old male with a fairly long history of pain and weakness left shoulder particularly to abduction, going on 8 to 9 months. He has had extensive workup including steroids, gabapentin, he had a C-spine MRI with severe left-sided C5-C6 and C6-C7 foraminal stenosis, he has also been working with Dr. Marikay Alar with neurosurgery. I saw him in February and he had some positive impingement signs, severe weakness and I was concerned about a cuff tear, we obtained a shoulder MRI that did not show any rotator cuff tearing, he did have some cuff tendinosis, labral tearing and no evidence of Parsonage-Turner syndrome. Sounds like he also had a nerve conduction study that showed some left-sided cervical radiculopathy though I do not have these results. At the last visit in May exam findings included a positive O'Brien sign, we did a glenohumeral joint injection. He did have some improvement in range of motion, only minimal improvement in strength. He is for the most part pain-free now. He does understand that the differential is broad, and the weakness may still be related to a C6 radiculopathy. As his pain is gone and his motion has improved, I do not think he is a candidate for any surgical intervention. I have advised to continue with aggressive home cervical spondylosis conditioning and home rotator cuff conditioning for at least 2 to 3 months. I would like to see him back in 2 to 3 months, and we can determine which direction to go should he still have any complaints. If he still has significant weakness then I would suggest he follow back up with Dr. Yetta Barre for discussion of ACDF.  If he only has pain and I suspect it is coming from a radicular origin we  will set him up for a cervical epidural.    ____________________________________________ James Mayer. Benjamin Stain, M.D., ABFM., CAQSM., AME. Primary Care and Sports Medicine [facility]  Adjunct Professor of Family Medicine  [place] [employer] [facility] [employer]  Restaurant manager, fast food

## 2023-01-16 NOTE — Telephone Encounter (Signed)
Patient came into office to bring FMLA paperwork, informed patient of $29.00 fee, forms placed in [place] box, thanks.

## 2023-01-22 ENCOUNTER — Encounter: Payer: Self-pay | Admitting: Family Medicine

## 2023-01-28 ENCOUNTER — Telehealth: Payer: Self-pay | Admitting: Pharmacist

## 2023-01-28 DIAGNOSIS — Z79899 Other long term (current) drug therapy: Secondary | ICD-10-CM

## 2023-01-28 DIAGNOSIS — L4050 Arthropathic psoriasis, unspecified: Secondary | ICD-10-CM

## 2023-01-28 NOTE — Telephone Encounter (Signed)
Patient's Taltz due for PA renewal. Patient has not been seen by Dr. Dimple Casey since starting Taltz on 09/11/22. It does not appear that patient ever filled Taltz with [facility] per dispense history in Epic and [facility] provider portal. He has OV on 02/07/23. Will await f/u from this visit regarding submitting any PA renewal for Taltz  CMM Key: [id number]  Chesley Mires, PharmD, MPH, BCPS, CPP Clinical Pharmacist (Rheumatology and Pulmonology)

## 2023-02-04 NOTE — Progress Notes (Unsigned)
Office Visit Note  Patient: James Mayer             Date of Birth: [birth year]           MRN: [id number]             PCP: Everrett Coombe, DO Referring: Everrett Coombe, DO Visit Date: 02/07/2023   Subjective:  No chief complaint on file.   History of Present Illness: James Mayer is a 42 y.o. male here for follow up for psoriatic arthritis.    Previous HPI 08/14/2022 James Mayer is a 42 y.o. male here for follow up for psoriatic arthritis.  Initial evaluation demonstrated elevated serum inflammatory markers with some inflammatory appearing peripheral joint pain and digital pitting changes.  Lab findings also had markedly elevated serum glucose he has been scheduled follow-up with his PCP office in about 2 weeks for further evaluation.  Liver function test showed mild elevations in AST and ALT.  He had some exacerbation of joint pains especially in his back and bilateral hands with an increase in swelling and difficulty closing his grip and took another course of oral prednisone starting January 12 for this which did improve symptoms again.  He is also scheduled for nerve conduction study based on his cervical spine disease with suspected impingement or radiculopathy and plans to follow-up further with his neurosurgeon regarding these results.   Previous HPI 07/17/22 James Mayer is a 42 y.o. male  here for evaluation of joint pains and elevated CRP. He has ongoing neck and back pain and [facility] severe left neural foraminal narrowing at C4-C5 and C5-C6 along with severe neural foraminal narrowing 2/2 to large left subarticular disc protrusion at C7-T1. Also with bilateral hand pain and intermittent swelling and mild xray changes concerning for possible RA. Overall a lot of his joint pains have been going on for years with some gradual progression over time.  He has taken meloxicam primarily prescribed for his back with some loss of  efficacy since switching to diclofenac he is seeing more improvement in symptoms.  He does definitely notice worsening of symptoms if he misses any of the twice daily dosing.  Biggest active problems right now in the neck back hands and feet.  He sometimes sees swelling of an entire finger joint at a time most recently the left third finger has been worst affected.  He has trouble tightly gripping and feels some decrease in grip strength.  He has noticed ulnar deviation and inability to fully close the second and third finger affecting both hands.  Pain is worse after heavy use or activity but also experiences pain at night and early morning if he misses any doses of the diclofenac. He experiences some skin rashes which have also been longstanding occurring on the flexor surfaces of his elbows and skin fold on the chest.  Reports dermatologist had expressed a concern for inverse pityriasis rosea but no topical treatment helped significantly, although also does not hurt or itch.  He has some pitting affecting fingernails on both hands.     Labs reviewed ESR 22 CRP 18.0 Uric acid 4.9   04/11/22 Xray right hand IMPRESSION: 1. Mild ulnar deviation of the second and third digits at the metacarpophalangeal joints. This is nonspecific, but may be an early manifestation of inflammatory arthropathy such as rheumatoid. 2. There is trace associated spurring of the second and third metacarpal phalangeal joints.   04/11/22  Xray left hand IMPRESSION: Possible slight ulnar subluxation of the thumb proximal phalanx at the metacarpophalangeal joint, nonspecific. Trace radiocarpal, typically degenerative. No specific findings to suggest inflammatory arthropathy.   No Rheumatology ROS completed.   PMFS History:  Patient Active Problem List   Diagnosis Date Noted   Left shoulder pain 08/28/2022   Impulsiveness 08/27/2022   Paresthesia of skin 08/27/2022   Type 2 diabetes mellitus without complications (HCC)  08/27/2022   Psoriatic arthritis (HCC) 08/14/2022   High risk medication use 08/14/2022   Bilateral hand pain 07/17/2022   Cervical spinal stenosis 06/11/2022   Serous otitis media 09/27/2021   Tinnitus, bilateral 02/06/2021   Anxiety disorder, unspecified 02/06/2021   Conduct disorder, unspecified 02/06/2021   Restless legs 01/15/2021   Neck pain 08/22/2020   Idiopathic peripheral neuropathy 08/22/2020   Cervical spondylosis 08/22/2020   Bronchitis 08/22/2020   Acute low back pain 01/27/2020   Acute left ankle pain 08/28/2019   Polydipsia 07/31/2019   Arthralgia 04/14/2019   Essential hypertension 08/26/2018   Chronic low back pain 08/26/2018    Past Medical History:  Diagnosis Date   Hypertension    Low back pain    Type 2 diabetes mellitus without complications (HCC)     Family History  Problem Relation Age of Onset   Hypertension Father    Hyperlipidemia Father    Diabetes Sister    Diabetes Maternal Grandmother    Cancer Maternal Grandmother    Cancer Maternal Grandfather    Diabetes Paternal Grandmother    Neurologic Disorder Paternal Grandfather    No past surgical history on file. Social History   Social History Narrative   Live w wife and 2 daughter full time and 2 other children 60% of the time   Right handed   Caffeine: decaf tea and coffee   Immunization History  Administered Date(s) Administered   Influenza, Seasonal, Injecte, Preservative Fre 07/15/2015   Moderna Sars-Covid-2 Vaccination 02/01/2020, 03/01/2020   Td (Adult) 09/28/2016   Tdap 07/16/2006     Objective: Vital Signs: There were no vitals taken for this visit.   Physical Exam   Musculoskeletal Exam: ***  CDAI Exam: CDAI Score: -- Patient Global: --; Provider Global: -- Swollen: --; Tender: -- Joint Exam 02/07/2023   No joint exam has been documented for this visit   There is currently no information documented on the homunculus. Go to the Rheumatology activity and complete  the homunculus joint exam.  Investigation: No additional findings.  Imaging: No results found.  Recent Labs: Lab Results  Component Value Date   WBC 7.0 07/17/2022   HGB 15.2 07/17/2022   PLT 325 07/17/2022   NA 131 (L) 07/17/2022   K 4.0 07/17/2022   CL 91 (L) 07/17/2022   CO2 25 07/17/2022   GLUCOSE 450 (H) 07/17/2022   BUN 20 07/17/2022   CREATININE 1.43 (H) 07/17/2022   BILITOT 1.1 07/17/2022   ALKPHOS 64 07/31/2019   AST 54 (H) 07/17/2022   ALT 91 (H) 07/17/2022   PROT 7.4 07/17/2022   ALBUMIN 4.5 07/31/2019   CALCIUM 10.6 (H) 07/17/2022   GFRAA >60 10/16/2019   QFTBGOLDPLUS NEGATIVE 07/17/2022    Speciality Comments: No specialty comments available.  Procedures:  No procedures performed Allergies: Patient has no known allergies.   Assessment / Plan:     Visit Diagnoses: No diagnosis found.  ***  Orders: No orders of the defined types were placed in this encounter.  No orders of the defined types  were placed in this encounter.    Follow-Up Instructions: No follow-ups on file.   Metta Clines, RT  Note - This record has been created using [employer].  Chart creation errors have been sought, but may not always  have been located. Such creation errors do not reflect on  the standard of medical care.

## 2023-02-07 ENCOUNTER — Encounter: Payer: Self-pay | Admitting: Internal Medicine

## 2023-02-07 ENCOUNTER — Ambulatory Visit: Payer: 59 | Attending: Internal Medicine | Admitting: Internal Medicine

## 2023-02-07 VITALS — BP 130/79 | HR 78 | Resp 12 | Ht 71.0 in | Wt 237.0 lb

## 2023-02-07 DIAGNOSIS — L4050 Arthropathic psoriasis, unspecified: Secondary | ICD-10-CM | POA: Diagnosis not present

## 2023-02-07 DIAGNOSIS — M47812 Spondylosis without myelopathy or radiculopathy, cervical region: Secondary | ICD-10-CM

## 2023-02-07 DIAGNOSIS — Z79899 Other long term (current) drug therapy: Secondary | ICD-10-CM

## 2023-02-07 LAB — CBC WITH DIFFERENTIAL/PLATELET
Absolute Monocytes: 628 cells/uL (ref 200–950)
Basophils Absolute: 118 cells/uL (ref 0–200)
Basophils Relative: 1.3 %
Eosinophils Absolute: 400 cells/uL (ref 15–500)
Eosinophils Relative: 4.4 %
HCT: 45.9 % (ref 38.5–50.0)
Hemoglobin: 15.8 g/dL (ref 13.2–17.1)
Lymphs Abs: 1902 cells/uL (ref 850–3900)
MCH: 28.5 pg (ref 27.0–33.0)
MCHC: 34.4 g/dL (ref 32.0–36.0)
MCV: 82.9 fL (ref 80.0–100.0)
MPV: 10.3 fL (ref 7.5–12.5)
Monocytes Relative: 6.9 %
Neutro Abs: 6052 cells/uL (ref 1500–7800)
Neutrophils Relative %: 66.5 %
Platelets: 297 10*3/uL (ref 140–400)
RBC: 5.54 10*6/uL (ref 4.20–5.80)
RDW: 13.2 % (ref 11.0–15.0)
Total Lymphocyte: 20.9 %
WBC: 9.1 10*3/uL (ref 3.8–10.8)

## 2023-02-07 LAB — SEDIMENTATION RATE: Sed Rate: 9 mm/h (ref 0–15)

## 2023-02-07 MED ORDER — DICLOFENAC SODIUM 75 MG PO TBEC
75.0000 mg | DELAYED_RELEASE_TABLET | Freq: Two times a day (BID) | ORAL | 2 refills | Status: DC | PRN
Start: 2023-02-07 — End: 2023-05-10

## 2023-02-07 NOTE — Progress Notes (Signed)
Patient will not reload Taltz per Dr. Dimple Casey. Will start with 80mg  SQ every 28 days. He is aware not to place back in fridge (BUD 4 days). Pharmacy info provided to patient in AVS  Medication Samples have been provided to the patient.  Drug name: Altamease Oiler 80mg /Ml autoinjector Qty: 1 pen LOT: [id number] EA Exp.Date: 02/04/2024  Dosing instructions: Inject 80mg  into the skin every 28 days  The patient has been instructed regarding the correct time, dose, and frequency of taking this medication, including desired effects and most common side effects.   Chesley Mires, PharmD, MPH, BCPS, CPP Clinical Pharmacist (Rheumatology and Pulmonology)

## 2023-02-07 NOTE — Telephone Encounter (Signed)
Patient's will restart Taltz 80mg /Ml every 28 days. He had positive response to loading dose but due to life events was unable to coordinate shipments from [facility].  Sample for Altamease Oiler provided to patient today.  Please renew Taltz PA once OV note from today is completed.  Chesley Mires, PharmD, MPH, BCPS, CPP Clinical Pharmacist (Rheumatology and Pulmonology)

## 2023-02-07 NOTE — Patient Instructions (Addendum)
Your next TALTZ dose is due on 03/07/23, 04/04/23, and every 4 weeks thereafter. YOU WILL ONLY USE ONE PEN MOVING FORWARD   CONTINUE diclofenac twice daily as needed   HOLD TALTZ if you have signs or symptoms of an infection. You can resume once you feel better or back to your baseline. HOLD TALTZ if you start antibiotics to treat an infection. HOLD TALTZ around the time of surgery/procedures. Your surgeon will be able to provide recommendations on when to hold BEFORE and when you are cleared to RESUME.   Pharmacy information: Your prescription will be shipped from [facility]. Their phone number is [phone] Please call to schedule shipment and confirm address. They will mail your medication to your home.   Cost information: Your copay should be affordable. If you call the pharmacy and it is not affordable, please double-check that they are billing through your copay card as secondary coverage. That copay card information is: BIN: [id number] PCN: [id number] GRP: [id number] ID: [id number] Expiration Date: 07/15/2025

## 2023-02-08 LAB — COMPLETE METABOLIC PANEL WITH GFR
Albumin: 4.7 g/dL (ref 3.6–5.1)
Chloride: 99 mmol/L (ref 98–110)

## 2023-02-08 MED ORDER — TALTZ 80 MG/ML ~~LOC~~ SOAJ
80.0000 mg | SUBCUTANEOUS | 1 refills | Status: DC
Start: 2023-02-08 — End: 2023-04-10

## 2023-02-08 NOTE — Telephone Encounter (Signed)
Submitted a Prior Authorization request to [employer] for TALTZ via CoverMyMeds. Will update once we receive a response.  Key: [id number]  Chesley Mires, PharmD, MPH, BCPS, CPP Clinical Pharmacist (Rheumatology and Pulmonology)

## 2023-02-08 NOTE — Telephone Encounter (Signed)
Received notification from [facility] regarding a prior authorization for TALTZ. Authorization has been APPROVED from 01/09/23 to 08/07/2023.   Patient must continue to fill through [facility]: [phone]  Authorization # [id number]  Labs from 02/07/23 stable to send Taltz refill. Refill sent and MyChart message sent to pt to advise.  Chesley Mires, PharmD, MPH, BCPS, CPP Clinical Pharmacist (Rheumatology and Pulmonology)

## 2023-02-26 ENCOUNTER — Encounter: Payer: Self-pay | Admitting: Family Medicine

## 2023-02-26 ENCOUNTER — Ambulatory Visit (INDEPENDENT_AMBULATORY_CARE_PROVIDER_SITE_OTHER): Payer: 59 | Admitting: Family Medicine

## 2023-02-26 VITALS — BP 116/72 | HR 78 | Ht 71.0 in | Wt 244.0 lb

## 2023-02-26 DIAGNOSIS — L4050 Arthropathic psoriasis, unspecified: Secondary | ICD-10-CM

## 2023-02-26 DIAGNOSIS — I10 Essential (primary) hypertension: Secondary | ICD-10-CM | POA: Diagnosis not present

## 2023-02-26 DIAGNOSIS — E119 Type 2 diabetes mellitus without complications: Secondary | ICD-10-CM | POA: Diagnosis not present

## 2023-02-26 DIAGNOSIS — F419 Anxiety disorder, unspecified: Secondary | ICD-10-CM

## 2023-02-26 LAB — POCT GLYCOSYLATED HEMOGLOBIN (HGB A1C): HbA1c, POC (controlled diabetic range): 7.1 % — AB (ref 0.0–7.0)

## 2023-02-26 MED ORDER — RYBELSUS 14 MG PO TABS: 14.0000 mg | ORAL_TABLET | Freq: Every day | ORAL | 1 refills | Status: DC

## 2023-02-26 NOTE — Addendum Note (Signed)
Addended by: Mammie Lorenzo on: 02/26/2023 10:03 AM   Modules accepted: Orders

## 2023-02-26 NOTE — Patient Instructions (Signed)
Increase Rybelsus to 14mg  daily.  Reduce insulin to 15 units daily.  Continue to work on dietary changes.  See me again in 4 months.

## 2023-02-26 NOTE — Assessment & Plan Note (Signed)
Continue lexapro with buspar.

## 2023-02-26 NOTE — Assessment & Plan Note (Signed)
BP remains well controlled.  Continue lisinopril/hydrochlorothiazide at current strength.

## 2023-02-26 NOTE — Assessment & Plan Note (Signed)
Blood sugars are much better controlled today. Decreased toujeo to 15 units and increase Rybelsus to 14mg .  Encouraged continued dietary changes.  We'll plan to see him back in about 4 months.

## 2023-02-26 NOTE — Assessment & Plan Note (Signed)
Managed by rheumatology.  Current on taltz.

## 2023-02-26 NOTE — Progress Notes (Signed)
James Mayer - 42 y.o. male MRN [id number]  Date of birth: 1980/09/01  Subjective Chief Complaint  Patient presents with   Diabetes   Hypertension    HPI James Mayer is a 42 y.o. male here today for follow up visit.   History of T2DM.  Current management with toujeo 20 units daily.  Also taking Rybelsus and metformin.  Blood sugars at home 150-180 post prandial. Weight increased since last visit.   Remains on lisinopril/hydrochlorothiazide for management of HTN.  BP is well controlled.  Denies symptoms related to HTN including chest pain, shortness of breath, palpitations, headache or vision changes.   Mood remains fairly stable with lexapro and buspar.  No significant side effects from this.    Haivng some increased radicular neck pain.  He is seeing Dr. Benjamin Stain and Dr. Yetta Barre at [facility] for this.   ROS:  A comprehensive ROS was completed and negative except as noted per HPI   No Known Allergies  Past Medical History:  Diagnosis Date   Hypertension    Low back pain    Torn rotator cuff    left   Type 2 diabetes mellitus without complications (HCC)     History reviewed. No pertinent surgical history.  Social History   Socioeconomic History   Marital status: Married    Spouse name: James Mayer   Number of children: 4   Years of education: Not on file   Highest education level: Some college, no degree  Occupational History   Not on file  Tobacco Use   Smoking status: Never    Passive exposure: Past   Smokeless tobacco: Never  Vaping Use   Vaping status: Never Used  Substance and Sexual Activity   Alcohol use: Yes    Alcohol/week: 1.0 standard drink of alcohol    Types: 1 Shots of liquor per week    Comment: rarely   Drug use: No   Sexual activity: Yes    Birth control/protection: None  Other Topics Concern   Not on file  Social History Narrative   Live w wife and 2 daughter full time and 2 other children 60% of the time   Right  handed   Caffeine: decaf tea and coffee   Social Determinants of Health   Financial Resource Strain: Medium Risk (01/16/2023)   Overall Financial Resource Strain (CARDIA)    Difficulty of Paying Living Expenses: Somewhat hard  Food Insecurity: Food Insecurity Present (01/16/2023)   Hunger Vital Sign    Worried About Running Out of Food in the Last Year: Sometimes true    Ran Out of Food in the Last Year: Sometimes true  Transportation Needs: No Transportation Needs (01/16/2023)   PRAPARE - Administrator, Civil Service (Medical): No    Lack of Transportation (Non-Medical): No  Physical Activity: Insufficiently Active (01/16/2023)   Exercise Vital Sign    Days of Exercise per Week: 1 day    Minutes of Exercise per Session: 30 min  Stress: Stress Concern Present (01/16/2023)   [employer] of Occupational Health - Occupational Stress Questionnaire    Feeling of Stress : To some extent  Social Connections: Moderately Integrated (01/16/2023)   Social Connection and Isolation Panel [NHANES]    Frequency of Communication with Friends and Family: Three times a week    Frequency of Social Gatherings with Friends and Family: Once a week    Attends Religious Services: 1 to 4 times per year    Active  Member of Clubs or Organizations: No    Attends Engineer, structural: Not on file    Marital Status: Married    Family History  Problem Relation Age of Onset   Hypertension Father    Hyperlipidemia Father    Diabetes Sister    Diabetes Maternal Grandmother    Cancer Maternal Grandmother    Cancer Maternal Grandfather    Diabetes Paternal Grandmother    Neurologic Disorder Paternal Grandfather     Health Maintenance  Topic Date Due   OPHTHALMOLOGY EXAM  03/29/2023 (Originally 05/03/1993)   COVID-19 Vaccine (3 - Moderna risk series) 06/14/2023 (Originally 03/29/2020)   INFLUENZA VACCINE  10/14/2023 (Originally 02/14/2023)   HIV Screening  02/26/2024 (Originally 05/03/1998)    Diabetic kidney evaluation - Urine ACR  08/28/2023   HEMOGLOBIN A1C  08/29/2023   FOOT EXAM  11/26/2023   Diabetic kidney evaluation - eGFR measurement  02/07/2024   DTaP/Tdap/Td (3 - Td or Tdap) 09/29/2026   Hepatitis C Screening  Completed   HPV VACCINES  Aged Out     ----------------------------------------------------------------------------------------------------------------------------------------------------------------------------------------------------------------- Physical Exam BP 116/72 (BP Location: Right Arm, Patient Position: Sitting, Cuff Size: Large)   Pulse 78   Ht 5\' 11"  (1.803 m)   Wt 244 lb (110.7 kg)   SpO2 98%   BMI 34.03 kg/m   Physical Exam Constitutional:      Appearance: Normal appearance.  HENT:     Head: Normocephalic and atraumatic.  Cardiovascular:     Rate and Rhythm: Normal rate and regular rhythm.  Pulmonary:     Effort: Pulmonary effort is normal.     Breath sounds: Normal breath sounds.  Neurological:     Mental Status: He is alert.  Psychiatric:        Mood and Affect: Mood normal.        Behavior: Behavior normal.     ------------------------------------------------------------------------------------------------------------------------------------------------------------------------------------------------------------------- Assessment and Plan  Essential hypertension BP remains well controlled.  Continue lisinopril/hydrochlorothiazide at current strength.    Type 2 diabetes mellitus without complications (HCC) Blood sugars are much better controlled today. Decreased toujeo to 15 units and increase Rybelsus to 14mg .  Encouraged continued dietary changes.  We'll plan to see him back in about 4 months.   Psoriatic arthritis (HCC) Managed by rheumatology.  Current on taltz.   Anxiety disorder, unspecified Continue lexapro with buspar.     Meds ordered this encounter  Medications   Semaglutide (RYBELSUS) 14 MG TABS     Sig: Take 1 tablet (14 mg total) by mouth daily.    Dispense:  90 tablet    Refill:  1    Return in about 4 months (around 06/28/2023) for T2DM/HTN.    This visit occurred during the SARS-CoV-2 public health emergency.  Safety protocols were in place, including screening questions prior to the visit, additional usage of staff PPE, and extensive cleaning of exam room while observing appropriate contact time as indicated for disinfecting solutions.

## 2023-03-01 ENCOUNTER — Other Ambulatory Visit: Payer: Self-pay | Admitting: Family Medicine

## 2023-03-20 ENCOUNTER — Other Ambulatory Visit: Payer: Self-pay | Admitting: Sports Medicine

## 2023-04-10 ENCOUNTER — Other Ambulatory Visit: Payer: Self-pay | Admitting: Internal Medicine

## 2023-04-10 DIAGNOSIS — L4050 Arthropathic psoriasis, unspecified: Secondary | ICD-10-CM

## 2023-04-10 DIAGNOSIS — Z79899 Other long term (current) drug therapy: Secondary | ICD-10-CM

## 2023-04-10 NOTE — Telephone Encounter (Signed)
Last Fill: 02/08/2023  Labs: 02/07/2023  Glucose 158 ALT 57  TB Gold: 07/17/2022 Negative   Next Visit: 05/10/2023  Last Visit: 02/07/2023  SW:FUXNATFTD arthritis   Current Dose per office note 02/07/2023: Plan is to resume taltz 80 mg [place]  monthly   Okay to refill Taltz?

## 2023-04-14 ENCOUNTER — Other Ambulatory Visit: Payer: Self-pay | Admitting: Internal Medicine

## 2023-04-14 DIAGNOSIS — Z79899 Other long term (current) drug therapy: Secondary | ICD-10-CM

## 2023-04-14 DIAGNOSIS — L4050 Arthropathic psoriasis, unspecified: Secondary | ICD-10-CM

## 2023-04-19 ENCOUNTER — Other Ambulatory Visit: Payer: Self-pay | Admitting: Family Medicine

## 2023-04-22 ENCOUNTER — Ambulatory Visit: Payer: 59 | Admitting: Sports Medicine

## 2023-04-29 NOTE — Progress Notes (Signed)
Office Visit Note  Patient: James Mayer             Date of Birth: [birth year]           MRN: [id number]             PCP: Everrett Coombe, DO Referring: Everrett Coombe, DO Visit Date: 05/10/2023   Subjective:  Follow-up   History of Present Illness: James Mayer is a 42 y.o. male here for follow up for psoriatic arthritis on taltz 80 mg [place] monthly. He was taking diclofenac 75 mg but stopped due to running out of medication, currently taking ibuprofen 800 mg in the morning and tylenol as needed in the evening.  He sees a significant improvement in pain and stiffness after taking the Cosentyx lasting for several weeks but increasing times again for about 1 week before the next dose is due.  Skin rash in the right elbow area unchanged otherwise is clear.  Most problematic joint is in the left shoulder.  He is missing work somewhat regularly due to symptoms either prolonged morning pain and stiffness or with days of exacerbation.  Previous HPI 02/07/2023 James Mayer is a 42 y.o. male here for follow up for psoriatic arthritis.  He started taltz after last visit with new start visit loading dose and had no problems reported feeling an improvement in his joint pain and stiffness within about 5 days. Says he never followed up on refilling this and starting the medicine at home due to depressive episode related to other life factors. Left shoulder and neck pain has been ongoing problem he had an injection with sports medicine clinic in left shoulder that improved his range of motion considerably. Still weak and numbness radiating down the arm. He also moved to a new house and his skin rash and flaking slightly increased. He thinks this is related to switching to city water.   Previous HPI 08/14/2022 James Mayer is a 42 y.o. male here for follow up for psoriatic arthritis.  Initial evaluation demonstrated elevated serum inflammatory markers with some inflammatory appearing  peripheral joint pain and digital pitting changes.  Lab findings also had markedly elevated serum glucose he has been scheduled follow-up with his PCP office in about 2 weeks for further evaluation.  Liver function test showed mild elevations in AST and ALT.  He had some exacerbation of joint pains especially in his back and bilateral hands with an increase in swelling and difficulty closing his grip and took another course of oral prednisone starting January 12 for this which did improve symptoms again.  He is also scheduled for nerve conduction study based on his cervical spine disease with suspected impingement or radiculopathy and plans to follow-up further with his neurosurgeon regarding these results.   Previous HPI 07/17/22 James Mayer is a 41 y.o. male  here for evaluation of joint pains and elevated CRP. He has ongoing neck and back pain and [facility] which showed severe left neural foraminal narrowing at C4-C5 and C5-C6 along with severe neural foraminal narrowing 2/2 to large left subarticular disc protrusion at C7-T1. Also with bilateral hand pain and intermittent swelling and mild xray changes concerning for possible RA. Overall a lot of his joint pains have been going on for years with some gradual progression over time.  He has taken meloxicam primarily prescribed for his back with some loss of efficacy since switching to diclofenac he is seeing more improvement  in symptoms.  He does definitely notice worsening of symptoms if he misses any of the twice daily dosing.  Biggest active problems right now in the neck back hands and feet.  He sometimes sees swelling of an entire finger joint at a time most recently the left third finger has been worst affected.  He has trouble tightly gripping and feels some decrease in grip strength.  He has noticed ulnar deviation and inability to fully close the second and third finger affecting both hands.  Pain is worse after heavy use  or activity but also experiences pain at night and early morning if he misses any doses of the diclofenac. He experiences some skin rashes which have also been longstanding occurring on the flexor surfaces of his elbows and skin fold on the chest.  Reports dermatologist had expressed a concern for inverse pityriasis rosea but no topical treatment helped significantly, although also does not hurt or itch.  He has some pitting affecting fingernails on both hands.     Labs reviewed ESR 22 CRP 18.0 Uric acid 4.9   04/11/22 Xray right hand IMPRESSION: 1. Mild ulnar deviation of the second and third digits at the metacarpophalangeal joints. This is nonspecific, but may be an early manifestation of inflammatory arthropathy such as rheumatoid. 2. There is trace associated spurring of the second and third metacarpal phalangeal joints.   04/11/22 Xray left hand IMPRESSION: Possible slight ulnar subluxation of the thumb proximal phalanx at the metacarpophalangeal joint, nonspecific. Trace radiocarpal, typically degenerative. No specific findings to suggest inflammatory arthropathy.   Review of Systems  Constitutional:  Positive for fatigue.  HENT:  Negative for mouth sores and mouth dryness.   Eyes:  Negative for dryness.  Respiratory:  Negative for shortness of breath.   Cardiovascular:  Negative for chest pain and palpitations.  Gastrointestinal:  Negative for blood in stool, constipation and diarrhea.  Endocrine: Positive for increased urination.  Genitourinary:  Negative for involuntary urination.  Musculoskeletal:  Positive for joint pain, joint pain, joint swelling, muscle weakness and morning stiffness. Negative for gait problem, myalgias, muscle tenderness and myalgias.  Skin:  Negative for color change, rash, hair loss and sensitivity to sunlight.  Allergic/Immunologic: Negative for susceptible to infections.  Neurological:  Negative for dizziness and headaches.  Hematological:  Negative  for swollen glands.  Psychiatric/Behavioral:  Positive for depressed mood and sleep disturbance. The patient is nervous/anxious.     PMFS History:  Patient Active Problem List   Diagnosis Date Noted   Left shoulder pain 08/28/2022   Impulsiveness 08/27/2022   Paresthesia of skin 08/27/2022   Type 2 diabetes mellitus without complications (HCC) 08/27/2022   Psoriatic arthritis (HCC) 08/14/2022   High risk medication use 08/14/2022   Bilateral hand pain 07/17/2022   Cervical spinal stenosis 06/11/2022   Serous otitis media 09/27/2021   Tinnitus, bilateral 02/06/2021   Anxiety disorder, unspecified 02/06/2021   Conduct disorder, unspecified 02/06/2021   Restless legs 01/15/2021   Neck pain 08/22/2020   Idiopathic peripheral neuropathy 08/22/2020   Cervical spondylosis 08/22/2020   Bronchitis 08/22/2020   Acute left ankle pain 08/28/2019   Arthralgia 04/14/2019   Essential hypertension 08/26/2018   Chronic low back pain 08/26/2018    Past Medical History:  Diagnosis Date   Hypertension    Low back pain    Torn rotator cuff    left   Type 2 diabetes mellitus without complications (HCC)     Family History  Problem Relation Age of Onset  Hypertension Father    Hyperlipidemia Father    Diabetes Sister    Diabetes Maternal Grandmother    Cancer Maternal Grandmother    Cancer Maternal Grandfather    Diabetes Paternal Grandmother    Neurologic Disorder Paternal Grandfather    History reviewed. No pertinent surgical history. Social History   Social History Narrative   Live w wife and 2 daughter full time and 2 other children 60% of the time   Right handed   Caffeine: decaf tea and coffee   Immunization History  Administered Date(s) Administered   Influenza, Seasonal, Injecte, Preservative Fre 07/15/2015   Moderna Sars-Covid-2 Vaccination 02/01/2020, 03/01/2020   Td (Adult) 09/28/2016   Tdap 07/16/2006     Objective: Vital Signs: BP (!) 153/94 (BP Location: Right  Arm, Patient Position: Sitting, Cuff Size: Normal)   Pulse 67   Resp 14   Ht 5\' 11"  (1.803 m)   Wt 256 lb (116.1 kg)   BMI 35.70 kg/m    Physical Exam Constitutional:      Appearance: He is obese.  Eyes:     Conjunctiva/sclera: Conjunctivae normal.  Cardiovascular:     Rate and Rhythm: Normal rate and regular rhythm.  Pulmonary:     Effort: Pulmonary effort is normal.     Breath sounds: Normal breath sounds.  Lymphadenopathy:     Cervical: No cervical adenopathy.  Skin:    General: Skin is warm and dry.     Findings: Rash present.     Comments: Flat well-circumscribed hyperpigmented rash on flexor surface of right elbow Digital pitting fingernails of both hands  Neurological:     Mental Status: He is alert.  Psychiatric:        Mood and Affect: Mood normal.      Musculoskeletal Exam:  Neck full ROM no tenderness Left shoulder tenderness to pressure on anterior border of the joint, range of motion is intact, normal strength, no palpable swelling Elbows full ROM no tenderness or swelling Wrists full ROM no tenderness or swelling Fingers full ROM no tenderness or swelling Hip normal internal and external rotation without pain, no tenderness to lateral hip palpation Knees full ROM no tenderness or swelling Ankles full ROM no tenderness or swelling   Investigation: No additional findings.  Imaging: No results found.  Recent Labs: Lab Results  Component Value Date   WBC 9.1 02/07/2023   HGB 15.8 02/07/2023   PLT 297 02/07/2023   NA 138 02/07/2023   K 4.1 02/07/2023   CL 99 02/07/2023   CO2 30 02/07/2023   GLUCOSE 158 (H) 02/07/2023   BUN 14 02/07/2023   CREATININE 0.93 02/07/2023   BILITOT 0.7 02/07/2023   ALKPHOS 64 07/31/2019   AST 35 02/07/2023   ALT 57 (H) 02/07/2023   PROT 7.1 02/07/2023   ALBUMIN 4.5 07/31/2019   CALCIUM 10.3 02/07/2023   GFRAA >60 10/16/2019   QFTBGOLDPLUS NEGATIVE 07/17/2022    Speciality Comments: No specialty comments  available.  Procedures:  No procedures performed Allergies: Patient has no known allergies.   Assessment / Plan:     Visit Diagnoses: Psoriatic arthritis (HCC) - Plan: Sedimentation rate, C-reactive protein, diclofenac (VOLTAREN) 75 MG EC tablet, ixekizumab (TALTZ) 80 MG/ML pen  Inflammatory arthritis appears well-controlled with no appreciable synovitis on exam today.  Rechecking sed rate and CRP for inflammatory disease activity monitoring.  Plan to continue Taltz 80 mg subcu monthly.  Will send new prescription for the diclofenac 75 mg 1 or 2 times daily  as needed considering persistent joint pain although neck and shoulder problem more consistent with his degenerative joint changes.  High risk medication use - resume taltz 80 mg [place] monthly, diclofenac 75 mg BID PRN - Plan: CBC with Differential/Platelet, COMPLETE METABOLIC PANEL WITH GFR, ixekizumab (TALTZ) 80 MG/ML pen  Checking CBC and CMP medication monitoring on continued long-term use of Taltz and oral NSAIDs.  Cervical spondylosis  Discussed this persistent left arm problems I do agree with her probably contributions from both though on review of his imaging neck disease looks worse than shoulder.  Limited improvement to previous steroid injection by Dr. Karie Schwalbe earlier this year.  Orders: Orders Placed This Encounter  Procedures   Sedimentation rate   C-reactive protein   CBC with Differential/Platelet   COMPLETE METABOLIC PANEL WITH GFR   Meds ordered this encounter  Medications   diclofenac (VOLTAREN) 75 MG EC tablet    Sig: Take 1 tablet (75 mg total) by mouth 2 (two) times daily as needed for moderate pain (pain score 4-6).    Dispense:  180 tablet    Refill:  0   ixekizumab (TALTZ) 80 MG/ML pen    Sig: Inject 1 mL (80 mg total) into the skin every 28 (twenty-eight) days.    Dispense:  1 mL    Refill:  2     Follow-Up Instructions: Return in about 3 months (around 08/10/2023) for PsA on taltz/NSAID f/u  3mos.   Fuller Plan, MD  Note - This record has been created using [employer].  Chart creation errors have been sought, but may not always  have been located. Such creation errors do not reflect on  the standard of medical care.

## 2023-05-10 ENCOUNTER — Encounter: Payer: Self-pay | Admitting: Internal Medicine

## 2023-05-10 ENCOUNTER — Ambulatory Visit: Payer: Non-veteran care | Attending: Internal Medicine | Admitting: Internal Medicine

## 2023-05-10 VITALS — BP 145/94 | HR 75 | Resp 14 | Ht 71.0 in | Wt 256.0 lb

## 2023-05-10 DIAGNOSIS — L4050 Arthropathic psoriasis, unspecified: Secondary | ICD-10-CM

## 2023-05-10 DIAGNOSIS — M47812 Spondylosis without myelopathy or radiculopathy, cervical region: Secondary | ICD-10-CM

## 2023-05-10 DIAGNOSIS — Z79899 Other long term (current) drug therapy: Secondary | ICD-10-CM | POA: Diagnosis not present

## 2023-05-10 MED ORDER — TALTZ 80 MG/ML ~~LOC~~ SOAJ
80.0000 mg | SUBCUTANEOUS | 2 refills | Status: DC
Start: 2023-05-10 — End: 2023-07-29

## 2023-05-10 MED ORDER — DICLOFENAC SODIUM 75 MG PO TBEC: 75.0000 mg | DELAYED_RELEASE_TABLET | Freq: Two times a day (BID) | ORAL | 0 refills | Status: DC | PRN

## 2023-05-11 LAB — CBC WITH DIFFERENTIAL/PLATELET
Absolute Lymphocytes: 1705 {cells}/uL (ref 850–3900)
Absolute Monocytes: 418 {cells}/uL (ref 200–950)
Basophils Absolute: 87 {cells}/uL (ref 0–200)
Basophils Relative: 1.5 %
Eosinophils Absolute: 197 {cells}/uL (ref 15–500)
Eosinophils Relative: 3.4 %
HCT: 43.6 % (ref 38.5–50.0)
Hemoglobin: 14.8 g/dL (ref 13.2–17.1)
MCH: 28.2 pg (ref 27.0–33.0)
MCHC: 33.9 g/dL (ref 32.0–36.0)
MCV: 83 fL (ref 80.0–100.0)
MPV: 10.1 fL (ref 7.5–12.5)
Monocytes Relative: 7.2 %
Neutro Abs: 3393 {cells}/uL (ref 1500–7800)
Neutrophils Relative %: 58.5 %
Platelets: 256 10*3/uL (ref 140–400)
RBC: 5.25 10*6/uL (ref 4.20–5.80)
RDW: 13.8 % (ref 11.0–15.0)
Total Lymphocyte: 29.4 %
WBC: 5.8 10*3/uL (ref 3.8–10.8)

## 2023-05-11 LAB — COMPLETE METABOLIC PANEL WITH GFR
AG Ratio: 1.6 (calc) (ref 1.0–2.5)
ALT: 90 U/L — ABNORMAL HIGH (ref 9–46)
AST: 46 U/L — ABNORMAL HIGH (ref 10–40)
Albumin: 4.2 g/dL (ref 3.6–5.1)
Alkaline phosphatase (APISO): 88 U/L (ref 36–130)
BUN: 14 mg/dL (ref 7–25)
CO2: 27 mmol/L (ref 20–32)
Calcium: 9.8 mg/dL (ref 8.6–10.3)
Chloride: 101 mmol/L (ref 98–110)
Creat: 0.85 mg/dL (ref 0.60–1.29)
Globulin: 2.6 g/dL (ref 1.9–3.7)
Glucose, Bld: 255 mg/dL — ABNORMAL HIGH (ref 65–99)
Potassium: 4 mmol/L (ref 3.5–5.3)
Sodium: 137 mmol/L (ref 135–146)
Total Bilirubin: 0.7 mg/dL (ref 0.2–1.2)
Total Protein: 6.8 g/dL (ref 6.1–8.1)
eGFR: 113 mL/min/{1.73_m2} (ref >=60)

## 2023-05-11 LAB — SEDIMENTATION RATE: Sed Rate: 14 mm/h (ref 0–15)

## 2023-05-11 LAB — C-REACTIVE PROTEIN: CRP: 12.4 mg/L — ABNORMAL HIGH (ref <8.0)

## 2023-05-13 ENCOUNTER — Ambulatory Visit: Payer: 59 | Admitting: Physician Assistant

## 2023-05-24 ENCOUNTER — Other Ambulatory Visit: Payer: Self-pay | Admitting: Family Medicine

## 2023-05-24 DIAGNOSIS — E119 Type 2 diabetes mellitus without complications: Secondary | ICD-10-CM

## 2023-05-24 MED ORDER — BD PEN NEEDLE NANO U/F 32G X 4 MM MISC: 99 refills | Status: AC

## 2023-05-27 ENCOUNTER — Ambulatory Visit (INDEPENDENT_AMBULATORY_CARE_PROVIDER_SITE_OTHER): Payer: 59 | Admitting: Family Medicine

## 2023-05-27 VITALS — BP 159/90 | HR 69 | Ht 71.0 in | Wt 251.0 lb

## 2023-05-27 DIAGNOSIS — I10 Essential (primary) hypertension: Secondary | ICD-10-CM | POA: Diagnosis not present

## 2023-05-27 DIAGNOSIS — E119 Type 2 diabetes mellitus without complications: Secondary | ICD-10-CM | POA: Diagnosis not present

## 2023-05-27 DIAGNOSIS — G8929 Other chronic pain: Secondary | ICD-10-CM

## 2023-05-27 DIAGNOSIS — M25512 Pain in left shoulder: Secondary | ICD-10-CM

## 2023-05-27 DIAGNOSIS — Z23 Encounter for immunization: Secondary | ICD-10-CM

## 2023-05-27 LAB — POCT GLYCOSYLATED HEMOGLOBIN (HGB A1C): HbA1c, POC (controlled diabetic range): 9.9 % — AB (ref 0.0–7.0)

## 2023-05-27 MED ORDER — RYBELSUS 14 MG PO TABS: 14.0000 mg | ORAL_TABLET | Freq: Every day | ORAL | 1 refills | Status: AC

## 2023-05-27 MED ORDER — LISINOPRIL-HYDROCHLOROTHIAZIDE 20-25 MG PO TABS: 1.0000 | ORAL_TABLET | Freq: Every day | ORAL | 0 refills | Status: DC

## 2023-05-27 MED ORDER — TOUJEO SOLOSTAR 300 UNIT/ML ~~LOC~~ SOPN: PEN_INJECTOR | SUBCUTANEOUS | 3 refills | Status: AC

## 2023-05-27 NOTE — Assessment & Plan Note (Signed)
Blood pressure is elevated.  He is out of medication at this time.  He will restart this and return in a couple weeks to recheck.

## 2023-05-27 NOTE — Progress Notes (Signed)
James Mayer - 42 y.o. male MRN [id number]  Date of birth: February 11, 1981  Subjective Chief Complaint  Patient presents with   Hypertension   Diabetes   Back Pain   Shoulder Pain    HPI James Mayer is a 42 y.o. male here today for follow up visit.   He reports that he is doing ok.   Rybelsus increased at last visit and toujeo decreased.  Reports that he is doing ok.  His A1c has increased to 9.9%.  Admits that diet has not been very good.  He has been out of medication for the past couple weeks as well.   BP has been well controlled with lisinopril/hydrochlorothiazide.   He has been getting this through the [place].  Has been out x 2 weeks recently.  Denies side effects at current strength.  He has not had chest pain, shortness of breath, palpitations, headache or vision changes.    Having increasing L shoulder pain.  Also with back pain.  He has been seeing Dr. Benjamin Stain as well as neurosurgery.  Injection into shoulder did help previously.  He requested appt. W/ Dr. Benjamin Stain, however did not get scheduled.  He reports that his employer is not allowing him to sit at all at work and he has to spend several hours standing on cement floor  ROS:  A comprehensive ROS was completed and negative except as noted per HPI  No Known Allergies  Past Medical History:  Diagnosis Date   Hypertension    Low back pain    Torn rotator cuff    left   Type 2 diabetes mellitus without complications (HCC)     History reviewed. No pertinent surgical history.  Social History   Socioeconomic History   Marital status: Married    Spouse name: Abbi   Number of children: 4   Years of education: Not on file   Highest education level: Associate degree: occupational, Scientist, product/process development, or vocational program  Occupational History   Not on file  Tobacco Use   Smoking status: Never    Passive exposure: Past   Smokeless tobacco: Never  Vaping Use   Vaping status: Never Used  Substance and Sexual  Activity   Alcohol use: Yes    Alcohol/week: 1.0 standard drink of alcohol    Types: 1 Shots of liquor per week    Comment: rarely   Drug use: No   Sexual activity: Yes    Birth control/protection: None  Other Topics Concern   Not on file  Social History Narrative   Live w wife and 2 daughter full time and 2 other children 60% of the time   Right handed   Caffeine: decaf tea and coffee   Social Determinants of Health   Financial Resource Strain: Medium Risk (05/27/2023)   Overall Financial Resource Strain (CARDIA)    Difficulty of Paying Living Expenses: Somewhat hard  Food Insecurity: Food Insecurity Present (05/27/2023)   Hunger Vital Sign    Worried About Running Out of Food in the Last Year: Sometimes true    Ran Out of Food in the Last Year: Sometimes true  Transportation Needs: No Transportation Needs (05/27/2023)   PRAPARE - Administrator, Civil Service (Medical): No    Lack of Transportation (Non-Medical): No  Physical Activity: Insufficiently Active (05/27/2023)   Exercise Vital Sign    Days of Exercise per Week: 1 day    Minutes of Exercise per Session: 10 min  Stress: Stress Concern  Present (05/27/2023)   [employer] of Occupational Health - Occupational Stress Questionnaire    Feeling of Stress : To some extent  Social Connections: Moderately Integrated (05/27/2023)   Social Connection and Isolation Panel [NHANES]    Frequency of Communication with Friends and Family: More than three times a week    Frequency of Social Gatherings with Friends and Family: Once a week    Attends Religious Services: More than 4 times per year    Active Member of Clubs or Organizations: No    Attends Engineer, structural: Not on file    Marital Status: Married    Family History  Problem Relation Age of Onset   Hypertension Father    Hyperlipidemia Father    Diabetes Sister    Diabetes Maternal Grandmother    Cancer Maternal Grandmother    Cancer  Maternal Grandfather    Diabetes Paternal Grandmother    Neurologic Disorder Paternal Grandfather     Health Maintenance  Topic Date Due   OPHTHALMOLOGY EXAM  Never done   COVID-19 Vaccine (3 - Moderna risk series) 06/14/2023 (Originally 03/29/2020)   HIV Screening  02/26/2024 (Originally 05/03/1998)   Diabetic kidney evaluation - Urine ACR  08/28/2023   HEMOGLOBIN A1C  11/24/2023   FOOT EXAM  11/26/2023   Diabetic kidney evaluation - eGFR measurement  05/09/2024   DTaP/Tdap/Td (3 - Td or Tdap) 09/29/2026   INFLUENZA VACCINE  Completed   Hepatitis C Screening  Completed   HPV VACCINES  Aged Out     ----------------------------------------------------------------------------------------------------------------------------------------------------------------------------------------------------------------- Physical Exam BP (!) 159/90 (BP Location: Left Arm, Patient Position: Sitting, Cuff Size: Large)   Pulse 69   Ht 5\' 11"  (1.803 m)   Wt 251 lb (113.9 kg)   SpO2 98%   BMI 35.01 kg/m   Physical Exam Constitutional:      Appearance: Normal appearance.  HENT:     Head: Normocephalic and atraumatic.  Eyes:     General: No scleral icterus. Cardiovascular:     Rate and Rhythm: Normal rate and regular rhythm.  Pulmonary:     Effort: Pulmonary effort is normal.     Breath sounds: Normal breath sounds.  Musculoskeletal:     Cervical back: Neck supple.  Neurological:     Mental Status: He is alert.  Psychiatric:        Mood and Affect: Mood normal.        Behavior: Behavior normal.     ------------------------------------------------------------------------------------------------------------------------------------------------------------------------------------------------------------------- Assessment and Plan  Essential hypertension Blood pressure is elevated.  He is out of medication at this time.  He will restart this and return in a couple weeks to recheck.  Type  2 diabetes mellitus without complications (HCC) Worsening control of diabetes.  Encouraged dietary changes as well as medication compliance.  Increasing insulin by 10 units.  Left shoulder pain Likely combination of cervical radiculitis as well as rotator cuff pain.  He is planning on following up with Dr. Benjamin Stain regarding this.   Meds ordered this encounter  Medications   lisinopril-hydrochlorothiazide (ZESTORETIC) 20-25 MG tablet    Sig: Take 1 tablet by mouth daily.    Dispense:  90 tablet    Refill:  0   Semaglutide (RYBELSUS) 14 MG TABS    Sig: Take 1 tablet (14 mg total) by mouth daily.    Dispense:  90 tablet    Refill:  1   insulin glargine, 1 Unit Dial, (TOUJEO SOLOSTAR) 300 UNIT/ML Solostar Pen    Sig: Inject  30 units daily.    Dispense:  3 mL    Refill:  3    No follow-ups on file.    This visit occurred during the SARS-CoV-2 public health emergency.  Safety protocols were in place, including screening questions prior to the visit, additional usage of staff PPE, and extensive cleaning of exam room while observing appropriate contact time as indicated for disinfecting solutions.

## 2023-05-27 NOTE — Patient Instructions (Signed)
Increase toujeo to 30units daily.  Continue Rybelsus at 14mg  daily.

## 2023-05-27 NOTE — Assessment & Plan Note (Signed)
Worsening control of diabetes.  Encouraged dietary changes as well as medication compliance.  Increasing insulin by 10 units.

## 2023-05-27 NOTE — Assessment & Plan Note (Signed)
Likely combination of cervical radiculitis as well as rotator cuff pain.  He is planning on following up with Dr. Benjamin Stain regarding this.

## 2023-05-27 NOTE — Progress Notes (Signed)
Pt will schedule Eye Exam.

## 2023-05-28 ENCOUNTER — Encounter: Payer: Self-pay | Admitting: Family Medicine

## 2023-05-28 LAB — LIPID PANEL WITH LDL/HDL RATIO
Cholesterol, Total: 362 mg/dL — ABNORMAL HIGH (ref 100–199)
HDL: 30 mg/dL — ABNORMAL LOW (ref >39)
LDL Chol Calc (NIH): 166 mg/dL — ABNORMAL HIGH (ref 0–99)
LDL/HDL Ratio: 5.5 ratio — ABNORMAL HIGH (ref 0.0–3.6)
Triglycerides: 789 mg/dL (ref 0–149)
VLDL Cholesterol Cal: 166 mg/dL — ABNORMAL HIGH (ref 5–40)

## 2023-05-28 LAB — CMP14+EGFR
ALT: 119 [IU]/L — ABNORMAL HIGH (ref 0–44)
AST: 61 [IU]/L — ABNORMAL HIGH (ref 0–40)
Albumin: 4.5 g/dL (ref 4.1–5.1)
Alkaline Phosphatase: 119 [IU]/L (ref 44–121)
BUN/Creatinine Ratio: 13 (ref 9–20)
BUN: 10 mg/dL (ref 6–24)
Bilirubin Total: 1 mg/dL (ref 0.0–1.2)
CO2: 24 mmol/L (ref 20–29)
Calcium: 9.5 mg/dL (ref 8.7–10.2)
Chloride: 96 mmol/L (ref 96–106)
Creatinine, Ser: 0.79 mg/dL (ref 0.76–1.27)
Globulin, Total: 2.3 g/dL (ref 1.5–4.5)
Glucose: 286 mg/dL — ABNORMAL HIGH (ref 70–99)
Potassium: 4.2 mmol/L (ref 3.5–5.2)
Sodium: 137 mmol/L (ref 134–144)
Total Protein: 6.8 g/dL (ref 6.0–8.5)
eGFR: 115 mL/min/{1.73_m2} (ref >59)

## 2023-05-28 LAB — CBC WITH DIFFERENTIAL/PLATELET
Basophils Absolute: 0.1 10*3/uL (ref 0.0–0.2)
Basos: 2 %
EOS (ABSOLUTE): 0.2 10*3/uL (ref 0.0–0.4)
Eos: 4 %
Hematocrit: 45.4 % (ref 37.5–51.0)
Hemoglobin: 15.4 g/dL (ref 13.0–17.7)
Immature Grans (Abs): 0.1 10*3/uL (ref 0.0–0.1)
Immature Granulocytes: 1 %
Lymphocytes Absolute: 1.5 10*3/uL (ref 0.7–3.1)
Lymphs: 27 %
MCH: 28.5 pg (ref 26.6–33.0)
MCHC: 33.9 g/dL (ref 31.5–35.7)
MCV: 84 fL (ref 79–97)
Monocytes Absolute: 0.4 10*3/uL (ref 0.1–0.9)
Monocytes: 6 %
Neutrophils Absolute: 3.3 10*3/uL (ref 1.4–7.0)
Neutrophils: 60 %
Platelets: 255 10*3/uL (ref 150–450)
RBC: 5.41 x10E6/uL (ref 4.14–5.80)
RDW: 13.4 % (ref 11.6–15.4)
WBC: 5.4 10*3/uL (ref 3.4–10.8)

## 2023-05-28 LAB — MICROALBUMIN / CREATININE URINE RATIO
Creatinine, Urine: 76.4 mg/dL
Microalb/Creat Ratio: 38 mg/g{creat} — ABNORMAL HIGH (ref 0–29)
Microalbumin, Urine: 29.1 ug/mL

## 2023-05-31 ENCOUNTER — Other Ambulatory Visit: Payer: Self-pay | Admitting: Family Medicine

## 2023-05-31 MED ORDER — ROSUVASTATIN CALCIUM 20 MG PO TABS: 20.0000 mg | ORAL_TABLET | Freq: Every day | ORAL | 3 refills | Status: AC

## 2023-06-06 ENCOUNTER — Ambulatory Visit (INDEPENDENT_AMBULATORY_CARE_PROVIDER_SITE_OTHER): Payer: 59 | Admitting: Sports Medicine

## 2023-06-06 ENCOUNTER — Encounter: Payer: Self-pay | Admitting: Sports Medicine

## 2023-06-06 DIAGNOSIS — M47812 Spondylosis without myelopathy or radiculopathy, cervical region: Secondary | ICD-10-CM | POA: Diagnosis not present

## 2023-06-06 DIAGNOSIS — M25512 Pain in left shoulder: Secondary | ICD-10-CM | POA: Diagnosis not present

## 2023-06-06 DIAGNOSIS — G8929 Other chronic pain: Secondary | ICD-10-CM

## 2023-06-06 NOTE — Assessment & Plan Note (Signed)
This is a very pleasant 42 year old male, he has had fairly complex situation with cervical radiculitis as well as primary shoulder pathology. Ultimately MRI did show labral tearing. We did a glenohumeral joint injection that seem to provide good relief until recently. Not having increasing pain in the left shoulder, on exam he has a positive crank test, positive O'Brien test. Rotator cuff testing is for the most part negative. I do suspect the labral tear is his pain generator, at this point I would like consultation with Dr. Everardo Pacific for discussion of operative fixation.

## 2023-06-06 NOTE — Assessment & Plan Note (Signed)
Known cervical DDD, he was having some paresthesias into the right hand, it seems like gabapentin was not that effective. Those paresthesias have since resolved. He is interested in coming off of the gabapentin due to excessive sedation, I am okay with this, but if paresthesias return we can restart gabapentin at 100 mg. He can continue to use tramadol as needed for the shoulder pain.

## 2023-06-06 NOTE — Progress Notes (Signed)
    Procedures performed today:    None.  Independent interpretation of notes and tests performed by another provider:   None.  Brief History, Exam, Impression, and Recommendations:    Left shoulder pain This is a very pleasant 42 year old male, he has had fairly complex situation with cervical radiculitis as well as primary shoulder pathology. Ultimately MRI did show labral tearing. We did a glenohumeral joint injection that seem to provide good relief until recently. Not having increasing pain in the left shoulder, on exam he has a positive crank test, positive O'Brien test. Rotator cuff testing is for the most part negative. I do suspect the labral tear is his pain generator, at this point I would like consultation with Dr. Everardo Pacific for discussion of operative fixation.  Cervical spondylosis Known cervical DDD, he was having some paresthesias into the right hand, it seems like gabapentin was not that effective. Those paresthesias have since resolved. He is interested in coming off of the gabapentin due to excessive sedation, I am okay with this, but if paresthesias return we can restart gabapentin at 100 mg. He can continue to use tramadol as needed for the shoulder pain.    ____________________________________________ Ihor Austin. Benjamin Stain, M.D., ABFM., CAQSM., AME. Primary Care and Sports Medicine [facility]  Adjunct Professor of Family Medicine  [employer] [place] [employer] [facility]  Restaurant manager, fast food

## 2023-06-28 ENCOUNTER — Ambulatory Visit: Payer: 59 | Admitting: Family Medicine

## 2023-07-11 ENCOUNTER — Ambulatory Visit: Payer: 59 | Admitting: Family Medicine

## 2023-07-11 ENCOUNTER — Telehealth: Payer: Self-pay | Admitting: Pharmacist

## 2023-07-11 NOTE — Telephone Encounter (Signed)
Received notification from [facility] regarding a prior authorization for TALTZ. Authorization has been APPROVED from 06/11/23 to 07/10/24.  Patient must continue to fill through [facility]: [phone]  Authorization # [id number]  Chesley Mires, PharmD, MPH, BCPS, CPP Clinical Pharmacist (Rheumatology and Pulmonology)

## 2023-07-11 NOTE — Telephone Encounter (Signed)
Submitted a Prior Authorization RENEWAL request to [employer] for TALTZ via CoverMyMeds. Will update once we receive a response.  Key: [id number]

## 2023-07-15 ENCOUNTER — Other Ambulatory Visit: Payer: Self-pay | Admitting: Sports Medicine

## 2023-07-15 ENCOUNTER — Other Ambulatory Visit: Payer: Self-pay | Admitting: Internal Medicine

## 2023-07-15 DIAGNOSIS — L4050 Arthropathic psoriasis, unspecified: Secondary | ICD-10-CM

## 2023-07-15 NOTE — Telephone Encounter (Signed)
Last Fill: 05/10/2023  Labs: 05/27/2023  Glucose 286 AST 61 ALT 119  Next Visit: 08/13/2023  Last Visit: 05/10/2023  DX: Psoriatic arthritis   Current Dose per office note 05/10/2023: diclofenac 75 mg BID PRN   Okay to refill Diclofenac?

## 2023-07-29 ENCOUNTER — Other Ambulatory Visit: Payer: Self-pay | Admitting: Internal Medicine

## 2023-07-29 DIAGNOSIS — Z79899 Other long term (current) drug therapy: Secondary | ICD-10-CM

## 2023-07-29 DIAGNOSIS — Z111 Encounter for screening for respiratory tuberculosis: Secondary | ICD-10-CM

## 2023-07-29 DIAGNOSIS — L4050 Arthropathic psoriasis, unspecified: Secondary | ICD-10-CM

## 2023-07-29 NOTE — Telephone Encounter (Signed)
 Last Fill: 05/10/2023  Labs: 05/27/2023  Glucose 286 AST 61 ALT 119  TB Gold: 07/17/2022 Negative   Next Visit: 08/13/2023  Last Visit: 05/10/2023  IK:Ednmpjupr arthritis   Current Dose per office note 05/10/2023: taltz  80 mg [place] monthly   Patient to update labs at upcomming appointment.   Okay to refill Taltz ?

## 2023-08-01 NOTE — Progress Notes (Deleted)
Office Visit Note  Patient: James Mayer             Date of Birth: 12-05-1980           MRN: [id number]             PCP: Everrett Coombe, DO Referring: Everrett Coombe, DO Visit Date: 08/13/2023   Subjective:  No chief complaint on file.   History of Present Illness: James Mayer is a 43 y.o. male here for follow up for psoriatic arthritis on taltz 80 mg [place] monthly.    Previous HPI 05/10/2023 James Mayer is a 43 y.o. male here for follow up for psoriatic arthritis on taltz 80 mg [place] monthly. He was taking diclofenac 75 mg but stopped due to running out of medication, currently taking ibuprofen 800 mg in the morning and tylenol as needed in the evening.  He sees a significant improvement in pain and stiffness after taking the Cosentyx lasting for several weeks but increasing times again for about 1 week before the next dose is due.  Skin rash in the right elbow area unchanged otherwise is clear.  Most problematic joint is in the left shoulder.  He is missing work somewhat regularly due to symptoms either prolonged morning pain and stiffness or with days of exacerbation.   Previous HPI 02/07/2023 James Mayer is a 44 y.o. male here for follow up for psoriatic arthritis.  He started taltz after last visit with new start visit loading dose and had no problems reported feeling an improvement in his joint pain and stiffness within about 5 days. Says he never followed up on refilling this and starting the medicine at home due to depressive episode related to other life factors. Left shoulder and neck pain has been ongoing problem he had an injection with sports medicine clinic in left shoulder that improved his range of motion considerably. Still weak and numbness radiating down the arm. He also moved to a new house and his skin rash and flaking slightly increased. He thinks this is related to switching to city water.   Previous HPI 08/14/2022 James Mayer is a 43 y.o.  male here for follow up for psoriatic arthritis.  Initial evaluation demonstrated elevated serum inflammatory markers with some inflammatory appearing peripheral joint pain and digital pitting changes.  Lab findings also had markedly elevated serum glucose he has been scheduled follow-up with his PCP office in about 2 weeks for further evaluation.  Liver function test showed mild elevations in AST and ALT.  He had some exacerbation of joint pains especially in his back and bilateral hands with an increase in swelling and difficulty closing his grip and took another course of oral prednisone starting January 12 for this which did improve symptoms again.  He is also scheduled for nerve conduction study based on his cervical spine disease with suspected impingement or radiculopathy and plans to follow-up further with his neurosurgeon regarding these results.   Previous HPI 07/17/22 James Mayer is a 43 y.o. male  here for evaluation of joint pains and elevated CRP. He has ongoing neck and back pain and [facility] showed severe left neural foraminal narrowing at C4-C5 and C5-C6 along with severe neural foraminal narrowing 2/2 to large left subarticular disc protrusion at C7-T1. Also with bilateral hand pain and intermittent swelling and mild xray changes concerning for possible RA. Overall a lot of his joint pains have been going on  for years with some gradual progression over time.  He has taken meloxicam primarily prescribed for his back with some loss of efficacy since switching to diclofenac he is seeing more improvement in symptoms.  He does definitely notice worsening of symptoms if he misses any of the twice daily dosing.  Biggest active problems right now in the neck back hands and feet.  He sometimes sees swelling of an entire finger joint at a time most recently the left third finger has been worst affected.  He has trouble tightly gripping and feels some decrease in grip  strength.  He has noticed ulnar deviation and inability to fully close the second and third finger affecting both hands.  Pain is worse after heavy use or activity but also experiences pain at night and early morning if he misses any doses of the diclofenac. He experiences some skin rashes which have also been longstanding occurring on the flexor surfaces of his elbows and skin fold on the chest.  Reports dermatologist had expressed a concern for inverse pityriasis rosea but no topical treatment helped significantly, although also does not hurt or itch.  He has some pitting affecting fingernails on both hands.     Labs reviewed ESR 22 CRP 18.0 Uric acid 4.9   04/11/22 Xray right hand IMPRESSION: 1. Mild ulnar deviation of the second and third digits at the metacarpophalangeal joints. This is nonspecific, but may be an early manifestation of inflammatory arthropathy such as rheumatoid. 2. There is trace associated spurring of the second and third metacarpal phalangeal joints.   04/11/22 Xray left hand IMPRESSION: Possible slight ulnar subluxation of the thumb proximal phalanx at the metacarpophalangeal joint, nonspecific. Trace radiocarpal, typically degenerative. No specific findings to suggest inflammatory arthropathy.   No Rheumatology ROS completed.   PMFS History:  Patient Active Problem List   Diagnosis Date Noted   Left shoulder pain 08/28/2022   Impulsiveness 08/27/2022   Paresthesia of skin 08/27/2022   Type 2 diabetes mellitus without complications (HCC) 08/27/2022   Psoriatic arthritis (HCC) 08/14/2022   High risk medication use 08/14/2022   Bilateral hand pain 07/17/2022   Serous otitis media 09/27/2021   Tinnitus, bilateral 02/06/2021   Anxiety disorder, unspecified 02/06/2021   Conduct disorder, unspecified 02/06/2021   Restless legs 01/15/2021   Idiopathic peripheral neuropathy 08/22/2020   Cervical spondylosis 08/22/2020   Bronchitis 08/22/2020   Acute left ankle  pain 08/28/2019   Arthralgia 04/14/2019   Essential hypertension 08/26/2018   Chronic low back pain 08/26/2018    Past Medical History:  Diagnosis Date   Hypertension    Low back pain    Torn rotator cuff    left   Type 2 diabetes mellitus without complications (HCC)     Family History  Problem Relation Age of Onset   Hypertension Father    Hyperlipidemia Father    Diabetes Sister    Diabetes Maternal Grandmother    Cancer Maternal Grandmother    Cancer Maternal Grandfather    Diabetes Paternal Grandmother    Neurologic Disorder Paternal Grandfather    No past surgical history on file. Social History   Social History Narrative   Live w wife and 2 daughter full time and 2 other children 60% of the time   Right handed   Caffeine: decaf tea and coffee   Immunization History  Administered Date(s) Administered   Influenza, Seasonal, Injecte, Preservative Fre 07/15/2015, 05/27/2023   Moderna Sars-Covid-2 Vaccination 02/01/2020, 03/01/2020   Td (Adult) 09/28/2016  Tdap 07/16/2006     Objective: Vital Signs: There were no vitals taken for this visit.   Physical Exam   Musculoskeletal Exam: ***  CDAI Exam: CDAI Score: -- Patient Global: --; Provider Global: -- Swollen: --; Tender: -- Joint Exam 08/13/2023   No joint exam has been documented for this visit   There is currently no information documented on the homunculus. Go to the Rheumatology activity and complete the homunculus joint exam.  Investigation: No additional findings.  Imaging: No results found.  Recent Labs: Lab Results  Component Value Date   WBC 5.4 05/27/2023   HGB 15.4 05/27/2023   PLT 255 05/27/2023   NA 137 05/27/2023   K 4.2 05/27/2023   CL 96 05/27/2023   CO2 24 05/27/2023   GLUCOSE 286 (H) 05/27/2023   BUN 10 05/27/2023   CREATININE 0.79 05/27/2023   BILITOT 1.0 05/27/2023   ALKPHOS 119 05/27/2023   AST 61 (H) 05/27/2023   ALT 119 (H) 05/27/2023   PROT 6.8 05/27/2023    ALBUMIN 4.5 05/27/2023   CALCIUM 9.5 05/27/2023   GFRAA >60 10/16/2019   QFTBGOLDPLUS NEGATIVE 07/17/2022    Speciality Comments: No specialty comments available.  Procedures:  No procedures performed Allergies: Patient has no known allergies.   Assessment / Plan:     Visit Diagnoses: No diagnosis found.  ***  Orders: No orders of the defined types were placed in this encounter.  No orders of the defined types were placed in this encounter.    Follow-Up Instructions: No follow-ups on file.   Metta Clines, RT  Note - This record has been created using [employer].  Chart creation errors have been sought, but may not always  have been located. Such creation errors do not reflect on  the standard of medical care.

## 2023-08-13 ENCOUNTER — Ambulatory Visit: Payer: Non-veteran care | Admitting: Internal Medicine

## 2023-08-13 DIAGNOSIS — M47812 Spondylosis without myelopathy or radiculopathy, cervical region: Secondary | ICD-10-CM

## 2023-08-13 DIAGNOSIS — Z79899 Other long term (current) drug therapy: Secondary | ICD-10-CM

## 2023-08-13 DIAGNOSIS — L4050 Arthropathic psoriasis, unspecified: Secondary | ICD-10-CM

## 2023-08-25 ENCOUNTER — Other Ambulatory Visit: Payer: Self-pay | Admitting: Family Medicine

## 2023-09-20 NOTE — Progress Notes (Deleted)
 Office Visit Note  Patient: James Mayer             Date of Birth: 01-Oct-1980           MRN: [id number]             PCP: Everrett Coombe, DO Referring: Everrett Coombe, DO Visit Date: 10/04/2023   Subjective:  No chief complaint on file.   History of Present Illness: James Mayer is a 43 y.o. male here for follow up for psoriatic arthritis on taltz 80 mg [place] monthly.    Previous HPI 05/10/2023 James Mayer is a 43 y.o. male here for follow up for psoriatic arthritis on taltz 80 mg [place] monthly. He was taking diclofenac 75 mg but stopped due to running out of medication, currently taking ibuprofen 800 mg in the morning and tylenol as needed in the evening.  He sees a significant improvement in pain and stiffness after taking the Cosentyx lasting for several weeks but increasing times again for about 1 week before the next dose is due.  Skin rash in the right elbow area unchanged otherwise is clear.  Most problematic joint is in the left shoulder.  He is missing work somewhat regularly due to symptoms either prolonged morning pain and stiffness or with days of exacerbation.   Previous HPI 02/07/2023 James Mayer is a 43 y.o. male here for follow up for psoriatic arthritis.  He started taltz after last visit with new start visit loading dose and had no problems reported feeling an improvement in his joint pain and stiffness within about 5 days. Says he never followed up on refilling this and starting the medicine at home due to depressive episode related to other life factors. Left shoulder and neck pain has been ongoing problem he had an injection with sports medicine clinic in left shoulder that improved his range of motion considerably. Still weak and numbness radiating down the arm. He also moved to a new house and his skin rash and flaking slightly increased. He thinks this is related to switching to city water.   Previous HPI 08/14/2022 James Mayer is a 43 y.o.  male here for follow up for psoriatic arthritis.  Initial evaluation demonstrated elevated serum inflammatory markers with some inflammatory appearing peripheral joint pain and digital pitting changes.  Lab findings also had markedly elevated serum glucose he has been scheduled follow-up with his PCP office in about 2 weeks for further evaluation.  Liver function test showed mild elevations in AST and ALT.  He had some exacerbation of joint pains especially in his back and bilateral hands with an increase in swelling and difficulty closing his grip and took another course of oral prednisone starting January 12 for this which did improve symptoms again.  He is also scheduled for nerve conduction study based on his cervical spine disease with suspected impingement or radiculopathy and plans to follow-up further with his neurosurgeon regarding these results.   Previous HPI 07/17/22 James Mayer is a 44 y.o. male  here for evaluation of joint pains and elevated CRP. He has ongoing neck and back pain and [facility] severe left neural foraminal narrowing at C4-C5 and C5-C6 along with severe neural foraminal narrowing 2/2 to large left subarticular disc protrusion at C7-T1. Also with bilateral hand pain and intermittent swelling and mild xray changes concerning for possible RA. Overall a lot of his joint pains have been going on  for years with some gradual progression over time.  He has taken meloxicam primarily prescribed for his back with some loss of efficacy since switching to diclofenac he is seeing more improvement in symptoms.  He does definitely notice worsening of symptoms if he misses any of the twice daily dosing.  Biggest active problems right now in the neck back hands and feet.  He sometimes sees swelling of an entire finger joint at a time most recently the left third finger has been worst affected.  He has trouble tightly gripping and feels some decrease in grip  strength.  He has noticed ulnar deviation and inability to fully close the second and third finger affecting both hands.  Pain is worse after heavy use or activity but also experiences pain at night and early morning if he misses any doses of the diclofenac. He experiences some skin rashes which have also been longstanding occurring on the flexor surfaces of his elbows and skin fold on the chest.  Reports dermatologist had expressed a concern for inverse pityriasis rosea but no topical treatment helped significantly, although also does not hurt or itch.  He has some pitting affecting fingernails on both hands.     Labs reviewed ESR 22 CRP 18.0 Uric acid 4.9   04/11/22 Xray right hand IMPRESSION: 1. Mild ulnar deviation of the second and third digits at the metacarpophalangeal joints. This is nonspecific, but may be an early manifestation of inflammatory arthropathy such as rheumatoid. 2. There is trace associated spurring of the second and third metacarpal phalangeal joints.   04/11/22 Xray left hand IMPRESSION: Possible slight ulnar subluxation of the thumb proximal phalanx at the metacarpophalangeal joint, nonspecific. Trace radiocarpal, typically degenerative. No specific findings to suggest inflammatory arthropathy.   No Rheumatology ROS completed.   PMFS History:  Patient Active Problem List   Diagnosis Date Noted   Left shoulder pain 08/28/2022   Impulsiveness 08/27/2022   Paresthesia of skin 08/27/2022   Type 2 diabetes mellitus without complications (HCC) 08/27/2022   Psoriatic arthritis (HCC) 08/14/2022   High risk medication use 08/14/2022   Bilateral hand pain 07/17/2022   Serous otitis media 09/27/2021   Tinnitus, bilateral 02/06/2021   Anxiety disorder, unspecified 02/06/2021   Conduct disorder, unspecified 02/06/2021   Restless legs 01/15/2021   Idiopathic peripheral neuropathy 08/22/2020   Cervical spondylosis 08/22/2020   Bronchitis 08/22/2020   Acute left ankle  pain 08/28/2019   Arthralgia 04/14/2019   Essential hypertension 08/26/2018   Chronic low back pain 08/26/2018    Past Medical History:  Diagnosis Date   Hypertension    Low back pain    Torn rotator cuff    left   Type 2 diabetes mellitus without complications (HCC)     Family History  Problem Relation Age of Onset   Hypertension Father    Hyperlipidemia Father    Diabetes Sister    Diabetes Maternal Grandmother    Cancer Maternal Grandmother    Cancer Maternal Grandfather    Diabetes Paternal Grandmother    Neurologic Disorder Paternal Grandfather    No past surgical history on file. Social History   Social History Narrative   Live w wife and 2 daughter full time and 2 other children 60% of the time   Right handed   Caffeine: decaf tea and coffee   Immunization History  Administered Date(s) Administered   Influenza, Seasonal, Injecte, Preservative Fre 07/15/2015, 05/27/2023   Moderna Sars-Covid-2 Vaccination 02/01/2020, 03/01/2020   Td (Adult) 09/28/2016  Tdap 07/16/2006     Objective: Vital Signs: There were no vitals taken for this visit.   Physical Exam   Musculoskeletal Exam: ***  CDAI Exam: CDAI Score: -- Patient Global: --; Provider Global: -- Swollen: --; Tender: -- Joint Exam 10/04/2023   No joint exam has been documented for this visit   There is currently no information documented on the homunculus. Go to the Rheumatology activity and complete the homunculus joint exam.  Investigation: No additional findings.  Imaging: No results found.  Recent Labs: Lab Results  Component Value Date   WBC 5.4 05/27/2023   HGB 15.4 05/27/2023   PLT 255 05/27/2023   NA 137 05/27/2023   K 4.2 05/27/2023   CL 96 05/27/2023   CO2 24 05/27/2023   GLUCOSE 286 (H) 05/27/2023   BUN 10 05/27/2023   CREATININE 0.79 05/27/2023   BILITOT 1.0 05/27/2023   ALKPHOS 119 05/27/2023   AST 61 (H) 05/27/2023   ALT 119 (H) 05/27/2023   PROT 6.8 05/27/2023    ALBUMIN 4.5 05/27/2023   CALCIUM 9.5 05/27/2023   GFRAA >60 10/16/2019   QFTBGOLDPLUS NEGATIVE 07/17/2022    Speciality Comments: No specialty comments available.  Procedures:  No procedures performed Allergies: Patient has no known allergies.   Assessment / Plan:     Visit Diagnoses: No diagnosis found.  ***  Orders: No orders of the defined types were placed in this encounter.  No orders of the defined types were placed in this encounter.    Follow-Up Instructions: No follow-ups on file.   Metta Clines, RT  Note - This record has been created using [employer].  Chart creation errors have been sought, but may not always  have been located. Such creation errors do not reflect on  the standard of medical care.

## 2023-09-26 ENCOUNTER — Other Ambulatory Visit: Payer: Self-pay | Admitting: Family Medicine

## 2023-09-30 ENCOUNTER — Other Ambulatory Visit: Payer: Self-pay | Admitting: Sports Medicine

## 2023-10-04 ENCOUNTER — Ambulatory Visit: Payer: Non-veteran care | Admitting: Internal Medicine

## 2023-10-04 ENCOUNTER — Telehealth: Payer: Self-pay | Admitting: Family Medicine

## 2023-10-04 DIAGNOSIS — M47812 Spondylosis without myelopathy or radiculopathy, cervical region: Secondary | ICD-10-CM

## 2023-10-04 DIAGNOSIS — Z79899 Other long term (current) drug therapy: Secondary | ICD-10-CM

## 2023-10-04 DIAGNOSIS — L4050 Arthropathic psoriasis, unspecified: Secondary | ICD-10-CM

## 2023-10-04 NOTE — Telephone Encounter (Signed)
 Copied from CRM [id number]. Topic: Medical Record Request - Records Request >> Oct 04, 2023 12:59 PM Nila Nephew wrote: Reason for CRM: Patient is calling to request that a CD with imaging records from February of 2024.  Sent message to patient via mychart

## 2023-10-04 NOTE — Telephone Encounter (Signed)
 Copied from CRM [id number]. Topic: Medical Record Request - Records Request >> Oct 04, 2023 12:59 PM Nila Nephew wrote: Reason for CRM: Patient is calling to request that a CD with imaging records from February of 2024.

## 2023-11-04 ENCOUNTER — Telehealth: Payer: Self-pay | Admitting: Internal Medicine

## 2023-11-04 NOTE — Telephone Encounter (Signed)
 FYIBrian Mayer from the Department of [facility] called stating they have closed patient's referral.  Patient will need a new referral from the [place] before he can schedule an appointment or it will not be covered.   Phone # [phone]

## 2023-11-06 NOTE — Telephone Encounter (Signed)
 Attempted to contact the patient and left a message to call the office back.

## 2023-11-07 NOTE — Telephone Encounter (Signed)
 [facility] called [facility] and [facility] so he can schedule a follow-up appointment.

## 2023-11-07 NOTE — Telephone Encounter (Signed)
 Attempted to contact the patient and left a message to call the office back.

## 2024-03-17 ENCOUNTER — Encounter: Payer: Self-pay | Admitting: Sports Medicine
